# Patient Record
Sex: Male | Born: 1975 | Race: White | Hispanic: No | Marital: Married | Smoking: Never smoker
Health system: Southern US, Community
[De-identification: ages and names within clinical notes are randomized; demographics above are authoritative.]

## PROBLEM LIST (undated history)

## (undated) DIAGNOSIS — T7840XA Allergy, unspecified, initial encounter: Secondary | ICD-10-CM

## (undated) HISTORY — DX: Allergy, unspecified, initial encounter: T78.40XA

## (undated) HISTORY — PX: CHOLECYSTECTOMY: SHX55

## (undated) HISTORY — PX: KNEE SURGERY: SHX244

## (undated) HISTORY — PX: HAND SURGERY: SHX662

---

## 2006-01-08 ENCOUNTER — Ambulatory Visit: Payer: Self-pay | Admitting: General Practice

## 2006-02-06 ENCOUNTER — Ambulatory Visit: Payer: Self-pay | Admitting: Unknown Physician Specialty

## 2007-07-01 ENCOUNTER — Ambulatory Visit: Payer: Self-pay | Admitting: Gastroenterology

## 2007-07-04 ENCOUNTER — Ambulatory Visit: Payer: Self-pay | Admitting: Gastroenterology

## 2007-07-05 ENCOUNTER — Emergency Department: Payer: Self-pay | Admitting: Internal Medicine

## 2007-07-22 ENCOUNTER — Ambulatory Visit: Payer: Self-pay | Admitting: General Surgery

## 2007-08-29 HISTORY — PX: CHOLECYSTECTOMY: SHX55

## 2008-12-10 ENCOUNTER — Emergency Department: Payer: Self-pay | Admitting: Emergency Medicine

## 2008-12-15 ENCOUNTER — Ambulatory Visit: Payer: Self-pay | Admitting: General Practice

## 2011-06-03 ENCOUNTER — Ambulatory Visit: Payer: Self-pay | Admitting: General Practice

## 2012-11-11 ENCOUNTER — Ambulatory Visit: Payer: Self-pay | Admitting: General Practice

## 2014-03-12 ENCOUNTER — Ambulatory Visit: Payer: Self-pay | Admitting: General Practice

## 2014-11-25 ENCOUNTER — Ambulatory Visit
Admit: 2014-11-25 | Disposition: A | Discharge status: Home / Self Care | Payer: Self-pay | Attending: General Practice | Admitting: General Practice

## 2015-11-30 ENCOUNTER — Other Ambulatory Visit: Payer: Self-pay | Admitting: Family Medicine

## 2015-11-30 DIAGNOSIS — M545 Low back pain: Secondary | ICD-10-CM

## 2015-12-06 ENCOUNTER — Ambulatory Visit
Admission: RE | Admit: 2015-12-06 | Discharge: 2015-12-06 | Disposition: A | Discharge status: Home / Self Care | Payer: BLUE CROSS/BLUE SHIELD | Source: Ambulatory Visit | Attending: Family Medicine | Admitting: Family Medicine

## 2015-12-06 DIAGNOSIS — M2578 Osteophyte, vertebrae: Secondary | ICD-10-CM | POA: Insufficient documentation

## 2015-12-06 DIAGNOSIS — M5136 Other intervertebral disc degeneration, lumbar region: Secondary | ICD-10-CM | POA: Diagnosis not present

## 2015-12-06 DIAGNOSIS — M79606 Pain in leg, unspecified: Secondary | ICD-10-CM | POA: Diagnosis present

## 2015-12-06 DIAGNOSIS — M545 Low back pain: Secondary | ICD-10-CM

## 2015-12-06 DIAGNOSIS — M5126 Other intervertebral disc displacement, lumbar region: Secondary | ICD-10-CM | POA: Diagnosis not present

## 2017-12-11 ENCOUNTER — Ambulatory Visit
Admission: RE | Admit: 2017-12-11 | Discharge: 2017-12-11 | Disposition: A | Discharge status: Home / Self Care | Payer: BLUE CROSS/BLUE SHIELD | Source: Ambulatory Visit | Attending: Family Medicine | Admitting: Family Medicine

## 2017-12-11 ENCOUNTER — Other Ambulatory Visit: Payer: Self-pay | Admitting: Family Medicine

## 2017-12-11 DIAGNOSIS — R52 Pain, unspecified: Secondary | ICD-10-CM

## 2017-12-11 DIAGNOSIS — W3189XA Contact with other specified machinery, initial encounter: Secondary | ICD-10-CM | POA: Diagnosis not present

## 2017-12-11 DIAGNOSIS — S60941A Unspecified superficial injury of left index finger, initial encounter: Secondary | ICD-10-CM | POA: Insufficient documentation

## 2018-03-17 ENCOUNTER — Other Ambulatory Visit: Payer: Self-pay

## 2018-03-17 ENCOUNTER — Emergency Department
Admission: EM | Admit: 2018-03-17 | Discharge: 2018-03-17 | Disposition: A | Discharge status: Home / Self Care | Payer: BLUE CROSS/BLUE SHIELD | Attending: Emergency Medicine | Admitting: Emergency Medicine

## 2018-03-17 ENCOUNTER — Encounter: Payer: Self-pay | Admitting: Emergency Medicine

## 2018-03-17 DIAGNOSIS — S61211A Laceration without foreign body of left index finger without damage to nail, initial encounter: Secondary | ICD-10-CM | POA: Insufficient documentation

## 2018-03-17 DIAGNOSIS — Y929 Unspecified place or not applicable: Secondary | ICD-10-CM | POA: Diagnosis not present

## 2018-03-17 DIAGNOSIS — Z9049 Acquired absence of other specified parts of digestive tract: Secondary | ICD-10-CM | POA: Diagnosis not present

## 2018-03-17 DIAGNOSIS — W260XXA Contact with knife, initial encounter: Secondary | ICD-10-CM | POA: Diagnosis not present

## 2018-03-17 DIAGNOSIS — Y93G1 Activity, food preparation and clean up: Secondary | ICD-10-CM | POA: Insufficient documentation

## 2018-03-17 DIAGNOSIS — Y998 Other external cause status: Secondary | ICD-10-CM | POA: Diagnosis not present

## 2018-03-17 MED ORDER — BACITRACIN ZINC 500 UNIT/GM EX OINT
1.0000 "application " | TOPICAL_OINTMENT | Freq: Once | CUTANEOUS | Status: AC
  Administered 2018-03-17: 1 via TOPICAL
  Filled 2018-03-17: qty 0.9

## 2018-03-17 MED ORDER — LIDOCAINE HCL (PF) 1 % IJ SOLN
5.0000 mL | Freq: Once | INTRAMUSCULAR | Status: AC
  Administered 2018-03-17: 5 mL
  Filled 2018-03-17: qty 5

## 2018-03-17 NOTE — ED Triage Notes (Signed)
Pt comes into the ED via POV c/o laceration to the left index finger after he was trying to cut meat to cook.  Patient has bleeding under control at this time and is in NAD.

## 2018-03-17 NOTE — Discharge Instructions (Addendum)
Wash the area with soap and water daily.  You may change the bandage tomorrow.  If it is not actively bleeding you could just apply a Band-Aid over the top of it while you are at work.  Once your home allow the air to get to the wound so it would heal.  If you see redness, swelling, increased pain or drainage like pus please return to the emergency department or [facility] physician [facility].  Apply Vaseline to the suture line once daily.

## 2018-03-17 NOTE — ED Provider Notes (Signed)
[facility] Emergency Department Provider Note  ____________________________________________   First MD Initiated Contact with Patient 03/17/18 1719     (approximate)  I have reviewed the triage vital signs and the nursing notes.   HISTORY  Chief Complaint Laceration    HPI Raymond Russell is a 42 y.o. male presents emergency department complaining of a laceration to the left index finger.  He was cutting a piece of meat and ended up cutting his finger.  He states his tetanus is up-to-date.  He denies any numbness or tingling.  He denies any other injuries.  History reviewed. No pertinent past medical history.  There are no active problems to display for this patient.   Past Surgical History:  Procedure Laterality Date  . CHOLECYSTECTOMY    . HAND SURGERY    . KNEE SURGERY      Prior to Admission medications   Not on File    Allergies Patient has no known allergies.  No family history on file.  Social History Social History   Tobacco Use  . Smoking status: Never Smoker  . Smokeless tobacco: Never Used  Substance Use Topics  . Alcohol use: Yes  . Drug use: Not Currently    Review of Systems  Constitutional: No fever/chills Eyes: No visual changes. ENT: No sore throat. Respiratory: Denies cough Genitourinary: Negative for dysuria. Musculoskeletal: Negative for back pain.  Positive for laceration to the left index finger Skin: Negative for rash.    ____________________________________________   PHYSICAL EXAM:  VITAL SIGNS: ED Triage Vitals [03/17/18 1714]  Enc Vitals Group     BP (!) 146/101     Pulse Rate 90     Resp 16     Temp 98.2 F (36.8 C)     Temp Source Oral     SpO2 99 %     Weight 225 lb (102.1 kg)     Height 6\' 2"  (1.88 m)     Head Circumference      Peak Flow      Pain Score 6     Pain Loc      Pain Edu?      Excl. in GC?     Constitutional: Alert and oriented. Well appearing and in no  acute distress. Eyes: Conjunctivae are normal.  Head: Atraumatic. Nose: No congestion/rhinnorhea. Mouth/Throat: Mucous membranes are moist.   Cardiovascular: Normal rate, regular rhythm. Respiratory: Normal respiratory effort.  No retractions GU: deferred Musculoskeletal: FROM all extremities, warm and well perfused.  No bony tenderness is noted at the left index finger.  There is a laceration along the lateral side of the index finger which is triangulated.  No foreign body is noted. Neurologic:  Normal speech and language.  Skin:  Skin is warm, dry .  Positive for 1.5 cm laceration to the left index finger; no rash noted. Psychiatric: Mood and affect are normal. Speech and behavior are normal.  ____________________________________________   LABS (all labs ordered are listed, but only abnormal results are displayed)  Labs Reviewed - No data to display ____________________________________________   ____________________________________________  RADIOLOGY    ____________________________________________   PROCEDURES  Procedure(s) performed:   Marland KitchenMarland KitchenLaceration Repair Date/Time: 03/17/2018 6:35 PM Performed by: Faythe Ghee, PA-C Authorized by: Faythe Ghee, PA-C   Consent:    Consent obtained:  Verbal   Consent given by:  Patient   Risks discussed:  Pain, poor wound healing and tendon damage   Alternatives discussed:  No treatment  Anesthesia (see MAR for exact dosages):    Anesthesia method:  Nerve block   Block needle gauge:  25 G   Block anesthetic:  Lidocaine 1% w/o epi   Block injection procedure:  Anatomic landmarks identified, introduced needle, incremental injection, negative aspiration for blood and anatomic landmarks palpated   Block outcome:  Anesthesia achieved Laceration details:    Location:  Finger   Finger location:  L index finger   Length (cm):  1.5   Depth (mm):  2 Repair type:    Repair type:  Simple Pre-procedure details:    Preparation:   Patient was prepped and draped in usual sterile fashion Exploration:    Hemostasis achieved with:  Direct pressure   Wound exploration: wound explored through full range of motion     Wound extent: no foreign bodies/material noted, no muscle damage noted and no tendon damage noted     Contaminated: no   Treatment:    Area cleansed with:  Betadine and saline   Amount of cleaning:  Standard   Irrigation solution:  Sterile saline   Irrigation method:  Tap and syringe Skin repair:    Repair method:  Sutures   Suture size:  5-0   Suture material:  Nylon   Suture technique:  Simple interrupted   Number of sutures:  8 Approximation:    Approximation:  Close Post-procedure details:    Dressing:  Antibiotic ointment and non-adherent dressing   Patient tolerance of procedure:  Tolerated well, no immediate complications      ____________________________________________   INITIAL IMPRESSION / ASSESSMENT AND PLAN / ED COURSE  Pertinent labs & imaging results that were available during my care of the patient were reviewed by me and considered in my medical decision making (see chart for details).  Patient is a 42 year old male presents emergency department complaining of laceration to the left index finger.  He states his tetanus is up-to-date.  On physical exam there is a triangulated 1.5 cm laceration to the lateral aspect of the left index finger.  He has full range of motion and no bony tenderness.  No foreign body is noted.  The laceration was repaired by doing a digital block with 1% Xylocaine without epi, the area was cleaned with normal saline and Betadine, 5-0 Ethilon was used to place 8 simple sutures.  Patient tolerated procedure well.  Patient was given instructions on how to care for the laceration.  He states he understands and will comply.  He is to [place] with [facility] [place] for suture removal in 7 to 8 days.  He states he understands will comply.  He was discharged  in stable condition     As part of my medical decision making, I reviewed the following data within the electronic MEDICAL RECORD NUMBER Nursing notes reviewed and incorporated, Old chart reviewed, Notes from prior ED visits and [place] Controlled Substance Database  ____________________________________________   FINAL CLINICAL IMPRESSION(S) / ED DIAGNOSES  Final diagnoses:  Laceration of left index finger without damage to nail, foreign body presence unspecified, initial encounter      NEW MEDICATIONS STARTED DURING THIS VISIT:  There are no discharge medications for this patient.    Note:  This document was prepared using Dragon voice recognition software and may include unintentional dictation errors.    Faythe GheeFisher, Ariahna Smiddy W, PA-C 03/17/18 Daneil [id number]    McShane, James A, MD 03/17/18 2027

## 2018-03-17 NOTE — ED Notes (Signed)
Bacitracin applied to wound, wrapped with Telfa and gauze. Secured with tape.

## 2019-10-31 NOTE — Progress Notes (Signed)
Patient comes in today for pre physical labs and EKG. Patient is scheduled with Ron Smith, PA-C.  

## 2019-11-03 ENCOUNTER — Ambulatory Visit: Payer: Self-pay

## 2019-11-03 ENCOUNTER — Other Ambulatory Visit: Payer: Self-pay

## 2019-11-03 DIAGNOSIS — Z0000 Encounter for general adult medical examination without abnormal findings: Secondary | ICD-10-CM

## 2019-11-03 LAB — POCT URINALYSIS DIPSTICK
Bilirubin, UA: NEGATIVE
Blood, UA: NEGATIVE
Glucose, UA: NEGATIVE
Ketones, UA: NEGATIVE
Leukocytes, UA: NEGATIVE
Nitrite, UA: NEGATIVE
Protein, UA: NEGATIVE
Spec Grav, UA: 1.025 (ref 1.010–1.025)
Urobilinogen, UA: 0.2 E.U./dL
pH, UA: 6 (ref 5.0–8.0)

## 2019-11-04 LAB — CMP12+LP+TP+TSH+6AC+PSA+CBC…
AST: 26 IU/L (ref 0–40)
Albumin/Globulin Ratio: 1.8 (ref 1.2–2.2)
Albumin: 4.6 g/dL (ref 4.0–5.0)
Alkaline Phosphatase: 45 IU/L (ref 39–117)
BUN/Creatinine Ratio: 17 (ref 9–20)
Calcium: 9 mg/dL (ref 8.7–10.2)
Chloride: 101 mmol/L (ref 96–106)
Chol/HDL Ratio: 5.2 ratio — ABNORMAL HIGH (ref 0.0–5.0)
Cholesterol, Total: 250 mg/dL — ABNORMAL HIGH (ref 100–199)
EOS (ABSOLUTE): 0.3 10*3/uL (ref 0.0–0.4)
Estimated CHD Risk: 1.1 times avg. — ABNORMAL HIGH (ref 0.0–1.0)
Free Thyroxine Index: 1.4 (ref 1.2–4.9)
GFR calc Af Amer: 91 mL/min/{1.73_m2} (ref >59)
Globulin, Total: 2.5 g/dL (ref 1.5–4.5)
Glucose: 98 mg/dL (ref 65–99)
HDL: 48 mg/dL (ref >39)
Hematocrit: 49.9 % (ref 37.5–51.0)
Hemoglobin: 17.1 g/dL (ref 13.0–17.7)
Immature Grans (Abs): 0.1 10*3/uL (ref 0.0–0.1)
LDL Chol Calc (NIH): 167 mg/dL — ABNORMAL HIGH (ref 0–99)
Lymphocytes Absolute: 2.1 10*3/uL (ref 0.7–3.1)
MCH: 30.4 pg (ref 26.6–33.0)
MCV: 89 fL (ref 79–97)
Neutrophils Absolute: 5.4 10*3/uL (ref 1.4–7.0)
Platelets: 276 10*3/uL (ref 150–450)
RBC: 5.63 x10E6/uL (ref 4.14–5.80)
Total Protein: 7.1 g/dL (ref 6.0–8.5)
Triglycerides: 191 mg/dL — ABNORMAL HIGH (ref 0–149)
Uric Acid: 8.7 mg/dL — ABNORMAL HIGH (ref 3.8–8.4)
VLDL Cholesterol Cal: 35 mg/dL (ref 5–40)
WBC: 8.7 10*3/uL (ref 3.4–10.8)

## 2019-11-04 LAB — CMP12+LP+TP+TSH+6AC+PSA+CBC?
ALT: 27 IU/L (ref 0–44)
BUN: 19 mg/dL (ref 6–24)
Basophils Absolute: 0.1 10*3/uL (ref 0.0–0.2)
Basos: 1 %
Bilirubin Total: 0.4 mg/dL (ref 0.0–1.2)
Creatinine, Ser: 1.12 mg/dL (ref 0.76–1.27)
Eos: 4 %
GFR calc non Af Amer: 79 mL/min/{1.73_m2} (ref >59)
GGT: 35 IU/L (ref 0–65)
Immature Granulocytes: 1 %
Iron: 142 ug/dL (ref 38–169)
LDH: 188 IU/L (ref 121–224)
Lymphs: 24 %
MCHC: 34.3 g/dL (ref 31.5–35.7)
Monocytes Absolute: 0.8 10*3/uL (ref 0.1–0.9)
Monocytes: 9 %
Neutrophils: 61 %
Phosphorus: 3.2 mg/dL (ref 2.8–4.1)
Potassium: 4.7 mmol/L (ref 3.5–5.2)
Prostate Specific Ag, Serum: 0.8 ng/mL (ref 0.0–4.0)
RDW: 13.4 % (ref 11.6–15.4)
Sodium: 138 mmol/L (ref 134–144)
T3 Uptake Ratio: 26 % (ref 24–39)
T4, Total: 5.5 ug/dL (ref 4.5–12.0)
TSH: 3.45 u[IU]/mL (ref 0.450–4.500)

## 2019-11-10 ENCOUNTER — Other Ambulatory Visit: Payer: Self-pay

## 2019-11-10 ENCOUNTER — Ambulatory Visit: Payer: Self-pay | Admitting: Physician Assistant

## 2019-11-10 VITALS — BP 143/94 | HR 88 | Temp 97.5°F | Ht 74.0 in | Wt 236.6 lb

## 2019-11-10 DIAGNOSIS — Z0000 Encounter for general adult medical examination without abnormal findings: Secondary | ICD-10-CM

## 2019-11-10 DIAGNOSIS — E785 Hyperlipidemia, unspecified: Secondary | ICD-10-CM

## 2019-11-10 DIAGNOSIS — E782 Mixed hyperlipidemia: Secondary | ICD-10-CM

## 2019-11-10 HISTORY — DX: Hyperlipidemia, unspecified: E78.5

## 2019-11-10 NOTE — Progress Notes (Signed)
   Subjective: Annual physical    Patient ID: Raymond Russell, male    DOB: 19-Aug-1976, 44 y.o.   MRN: [id number]  HPI Patient presents with annual physical.  Patient voiced no concerns or complaints.   Review of Systems Negative    Objective:   Physical Exam No acute distress. HEENT is unremarkable. Neck is supple for adenopathy or bruits. Lungs are clear to auscultation. Heart regular rate and rhythm. Abdomen with negative HSM, normoactive bowel sounds, soft and nontender to palpation. No obvious deformity to the upper or lower extremities.  Upper and lower extremities have full/ equal range of motion. No obvious deformity to the cervical lumbar spine.  Patient has full equal range of motion of the cervical lumbar spine. Cranial nerves II through XII are grossly intact. Reviewed patient's lab results showing elevated uric acid level and hyperlipidemia.       Assessment & Plan: Well exam  Hyperlipidemia.  Discussed lab results with patient elected to try 6 months of diet and exercise to control lipids.  Patient will follow up in 6 months.

## 2020-03-08 IMAGING — CR DG FINGER INDEX 2+V*L*
1 series · 3 of 3 positions shown · non-contrast
Comparison: None.

CLINICAL DATA: Drill bit penetrated finger

EXAM:
LEFT SECOND FINGER 2+V

[Series 1: dg finger index left · 0.14mm/px · 3 of 3 slices shown]
[im 1/3]
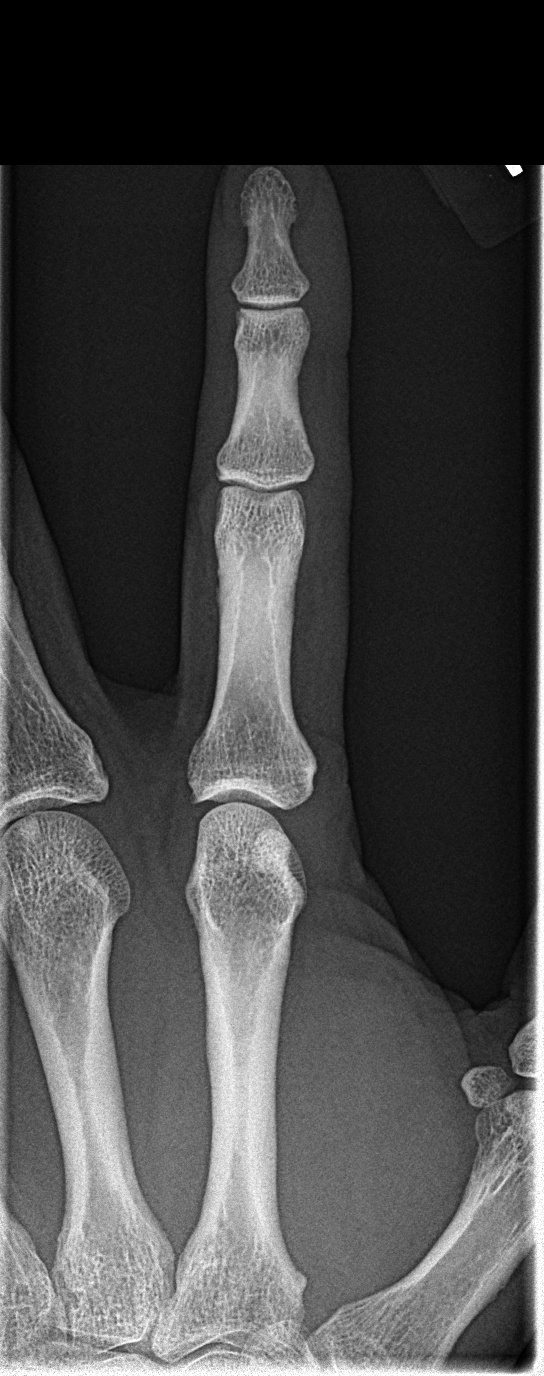
[im 2/3]
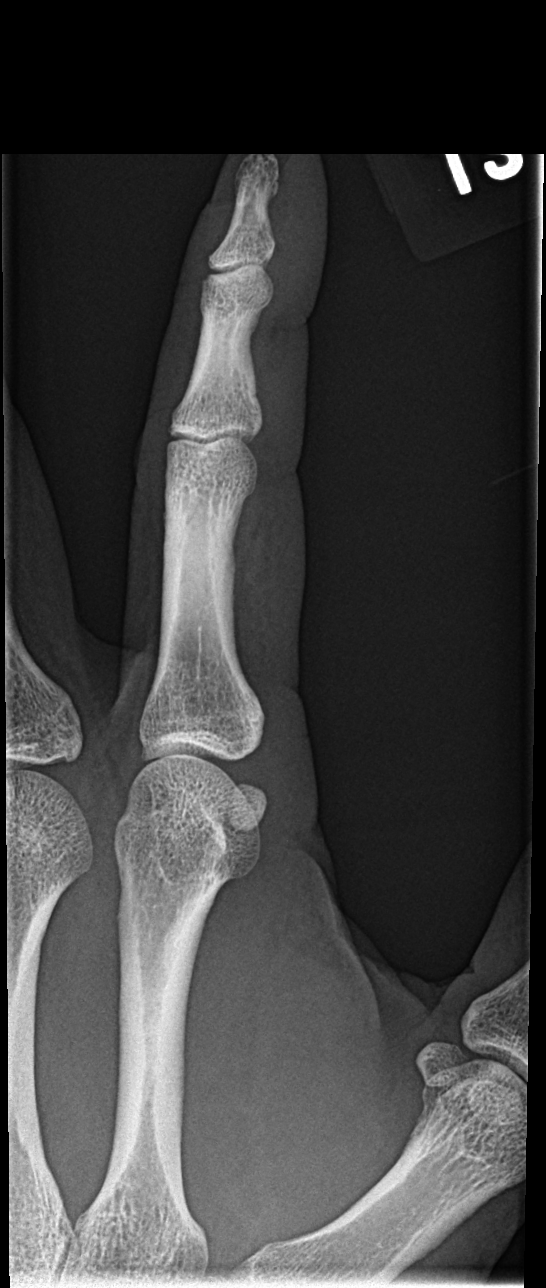
[im 3/3]
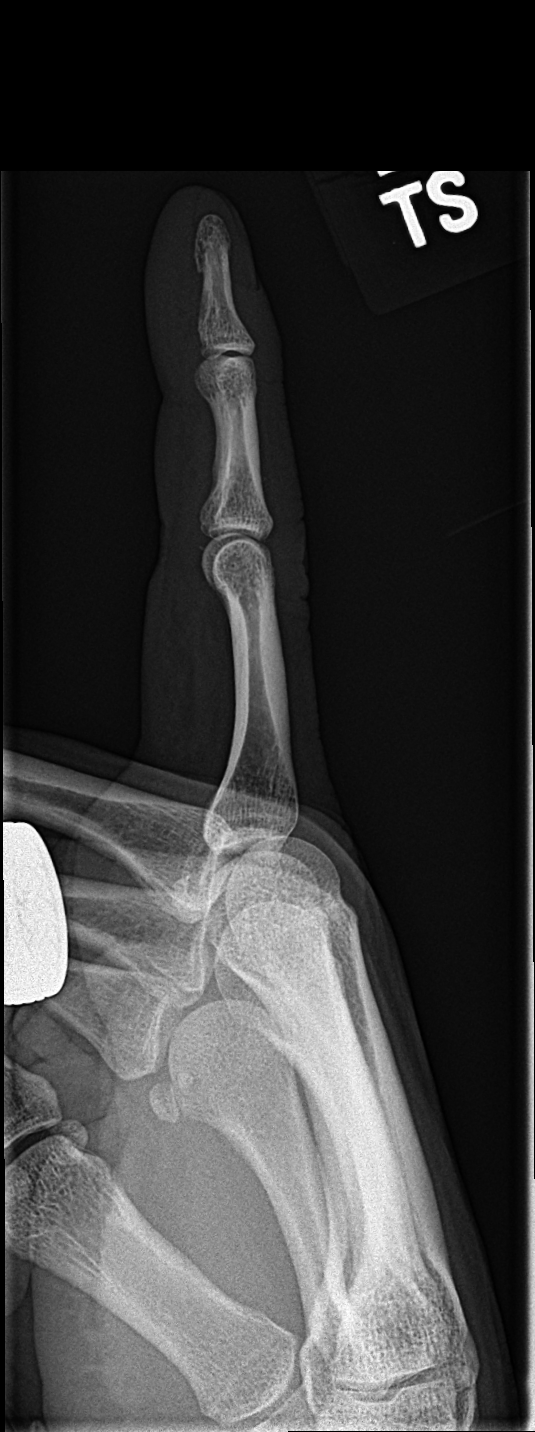

[3 of 3 positions shown; findings below may reference images not displayed]

FINDINGS: Frontal, oblique, and lateral views obtained. No fracture or
dislocation. Joint spaces appear intact. No radiopaque foreign body
or soft tissue air.
IMPRESSION: No soft tissue air or radiopaque foreign body. No fracture or
dislocation. No evident arthropathy.

## 2020-03-10 ENCOUNTER — Ambulatory Visit: Payer: 59

## 2020-03-16 ENCOUNTER — Encounter: Payer: Self-pay | Admitting: Physician Assistant

## 2020-03-16 ENCOUNTER — Other Ambulatory Visit: Payer: Self-pay

## 2020-03-16 ENCOUNTER — Ambulatory Visit: Payer: Self-pay | Admitting: Physician Assistant

## 2020-03-16 VITALS — BP 120/84 | HR 86 | Temp 98.8°F | Resp 16 | Ht 74.0 in | Wt 228.0 lb

## 2020-03-16 DIAGNOSIS — M25532 Pain in left wrist: Secondary | ICD-10-CM

## 2020-03-16 NOTE — Progress Notes (Signed)
   Subjective: Left elbow pain    Patient ID: Raymond Russell, male    DOB: [birth year], 44 y.o.   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  Patient states 2 years ago he received moderate relief of epicondyle steroid injection by orthopedics.  Patient stated about 6 weeks to 2 months injection effects wore off and he was placed in physical therapy and again mild relief.  Patient states physical therapy is no longer helping with the pain.  Patient also complained of a new onset of morning stiffness in the bilateral upper extremities.  Patient also complained of increased pain and swelling to the right middle finger.  No provocative incident for complaint.  Patient is requesting consult to Ortho for evaluation and possible steroidal injection.   Review of Systems     Objective:   Physical Exam No acute distress.  No obvious deformity to the upper extremities.  Patient is moderate guarding palpation medial epicondyle of the left elbow.  No edema or erythema.  Patient also has marked edema to the MPJ of the third digit right hand.       Assessment & Plan: Left elbow epicondylitis  Patient complaint physical exam is also consistent with arthralgia.  Further evaluation with CBC and sed rate.  Consult orthopedic for further evaluation and treatment of epicondylitis.  Follow-up after lab test.

## 2020-03-16 NOTE — Progress Notes (Signed)
Pt has reoccurring pain in elbow and states that therapy is not helping that it only helps for that day and then starts over. Pt also states that a couple months ago his hands and fingers have started to get stiff with swelling and pain in joints of fingers. CL, RMA

## 2020-03-17 LAB — SEDIMENTATION RATE: Sed Rate: 8 mm/hr (ref 0–15)

## 2020-03-17 LAB — CBC WITH DIFFERENTIAL/PLATELET
Basophils Absolute: 0.1 x10E3/uL (ref 0.0–0.2)
Basos: 1 %
EOS (ABSOLUTE): 0.2 x10E3/uL (ref 0.0–0.4)
Eos: 2 %
Hematocrit: 50.2 % (ref 37.5–51.0)
Hemoglobin: 16.3 g/dL (ref 13.0–17.7)
Immature Grans (Abs): 0 x10E3/uL (ref 0.0–0.1)
Immature Granulocytes: 1 %
Lymphocytes Absolute: 2 x10E3/uL (ref 0.7–3.1)
Lymphs: 23 %
MCH: 29.5 pg (ref 26.6–33.0)
MCHC: 32.5 g/dL (ref 31.5–35.7)
MCV: 91 fL (ref 79–97)
Monocytes Absolute: 0.6 x10E3/uL (ref 0.1–0.9)
Monocytes: 7 %
Neutrophils Absolute: 5.7 x10E3/uL (ref 1.4–7.0)
Neutrophils: 66 %
Platelets: 287 x10E3/uL (ref 150–450)
RBC: 5.52 x10E6/uL (ref 4.14–5.80)
RDW: 13.7 % (ref 11.6–15.4)
WBC: 8.6 x10E3/uL (ref 3.4–10.8)

## 2020-03-17 LAB — RHEUMATOID FACTOR: Rheumatoid fact SerPl-aCnc: <10 [IU]/mL (ref 0.0–13.9)

## 2020-03-22 NOTE — Addendum Note (Signed)
Addended by: Gardner Candle on: 03/22/2020 11:24 AM   Modules accepted: Orders

## 2020-04-05 DIAGNOSIS — M654 Radial styloid tenosynovitis [de Quervain]: Secondary | ICD-10-CM | POA: Diagnosis not present

## 2020-04-05 DIAGNOSIS — M7712 Lateral epicondylitis, left elbow: Secondary | ICD-10-CM | POA: Diagnosis not present

## 2020-05-12 ENCOUNTER — Other Ambulatory Visit: Payer: Self-pay

## 2020-09-03 ENCOUNTER — Other Ambulatory Visit: Payer: Self-pay

## 2020-09-03 DIAGNOSIS — Z1152 Encounter for screening for COVID-19: Secondary | ICD-10-CM

## 2020-09-07 LAB — NOVEL CORONAVIRUS, NAA: SARS-CoV-2, NAA: DETECTED — AB

## 2020-11-15 ENCOUNTER — Other Ambulatory Visit: Payer: Self-pay

## 2020-11-15 ENCOUNTER — Encounter: Payer: Self-pay | Admitting: Physician Assistant

## 2020-11-15 ENCOUNTER — Ambulatory Visit: Payer: Self-pay | Admitting: Physician Assistant

## 2020-11-15 VITALS — BP 131/93 | HR 86 | Temp 97.7°F | Resp 12 | Ht 74.0 in | Wt 241.0 lb

## 2020-11-15 DIAGNOSIS — M7662 Achilles tendinitis, left leg: Secondary | ICD-10-CM

## 2020-11-15 MED ORDER — LIDOCAINE 5 % EX PTCH: 1.0000 | MEDICATED_PATCH | CUTANEOUS | 0 refills | Status: DC

## 2020-11-15 MED ORDER — NAPROXEN 500 MG PO TABS: 500.0000 mg | ORAL_TABLET | Freq: Two times a day (BID) | ORAL | Status: DC

## 2020-11-15 NOTE — Progress Notes (Signed)
   Subjective: Left posterior heel pain    Patient ID: Raymond Russell, male    DOB: July 04, 1976, 45 y.o.   MRN: [id number]  HPI Patient complains of nonprovocative left posterior heel pain for 5 days.  Patient states no relief over-the-counter Aleve.  Describes the pain as "achy".  Rates the pain as a 5/10.  States pain increased with flexion/ extension and ambulation.   Review of Systems    Negative except for complaint Objective:   Physical Exam No acute distress.  Normal gait.  BP is 131/93, pulse 86, respiration 12, temperature 97.7, and patient is 90% O2 sat on room air. Mild bilateral pes planus.  Mild edema at insertion point of the Achilles tendon.  Moderate guarding with palpation.  Complain of pain with extension of the foot.       Assessment & Plan: Achilles tendinitis  Discussed conservative treatment consisting of NSAIDs Lidoderm patches.  Advised to follow-up in 1 week if no improvement or worsening complaint.  We will consider x-rays and podiatry consult.

## 2020-11-15 NOTE — Progress Notes (Signed)
Left heel pain since last Wednesday. No known injury. Feels like a knot & feels tight. Has been taking Aleve No ice/heat or elastic support.  AMD

## 2020-12-23 NOTE — Progress Notes (Signed)
Scheduled to complete physical 12/29/20 with Ron Smith, PA-C.  AMD 

## 2020-12-24 ENCOUNTER — Other Ambulatory Visit: Payer: Self-pay

## 2020-12-24 ENCOUNTER — Ambulatory Visit: Payer: Self-pay

## 2020-12-24 DIAGNOSIS — Z0000 Encounter for general adult medical examination without abnormal findings: Secondary | ICD-10-CM

## 2020-12-24 LAB — POCT URINALYSIS DIPSTICK
Bilirubin, UA: NEGATIVE
Blood, UA: NEGATIVE
Glucose, UA: NEGATIVE
Ketones, UA: NEGATIVE
Leukocytes, UA: NEGATIVE
Nitrite, UA: NEGATIVE
Protein, UA: NEGATIVE
Spec Grav, UA: 1.02 (ref 1.010–1.025)
Urobilinogen, UA: 0.2 E.U./dL
pH, UA: 6 (ref 5.0–8.0)

## 2020-12-25 LAB — CMP12+LP+TP+TSH+6AC+PSA+CBC…
ALT: 27 IU/L (ref 0–44)
AST: 21 IU/L (ref 0–40)
Albumin/Globulin Ratio: 1.8 (ref 1.2–2.2)
Albumin: 4.6 g/dL (ref 4.0–5.0)
Alkaline Phosphatase: 46 IU/L (ref 44–121)
BUN/Creatinine Ratio: 10 (ref 9–20)
BUN: 10 mg/dL (ref 6–24)
Basophils Absolute: 0.1 10*3/uL (ref 0.0–0.2)
Basos: 1 %
Bilirubin Total: 0.3 mg/dL (ref 0.0–1.2)
Calcium: 9 mg/dL (ref 8.7–10.2)
Chloride: 101 mmol/L (ref 96–106)
Chol/HDL Ratio: 4.6 ratio (ref 0.0–5.0)
Cholesterol, Total: 213 mg/dL — ABNORMAL HIGH (ref 100–199)
Creatinine, Ser: 1.03 mg/dL (ref 0.76–1.27)
EOS (ABSOLUTE): 0.3 10*3/uL (ref 0.0–0.4)
Eos: 4 %
Estimated CHD Risk: 0.9 times avg. (ref 0.0–1.0)
Free Thyroxine Index: 1.8 (ref 1.2–4.9)
GGT: 29 IU/L (ref 0–65)
Globulin, Total: 2.5 g/dL (ref 1.5–4.5)
Glucose: 87 mg/dL (ref 65–99)
HDL: 46 mg/dL (ref >39)
Hematocrit: 49.6 % (ref 37.5–51.0)
Hemoglobin: 16.2 g/dL (ref 13.0–17.7)
Immature Grans (Abs): 0 10*3/uL (ref 0.0–0.1)
Immature Granulocytes: 0 %
Iron: 67 ug/dL (ref 38–169)
LDH: 225 IU/L — ABNORMAL HIGH (ref 121–224)
LDL Chol Calc (NIH): 157 mg/dL — ABNORMAL HIGH (ref 0–99)
Lymphocytes Absolute: 1.9 10*3/uL (ref 0.7–3.1)
Lymphs: 25 %
MCH: 29.3 pg (ref 26.6–33.0)
MCHC: 32.7 g/dL (ref 31.5–35.7)
MCV: 90 fL (ref 79–97)
Monocytes Absolute: 0.6 10*3/uL (ref 0.1–0.9)
Monocytes: 8 %
Neutrophils Absolute: 4.7 10*3/uL (ref 1.4–7.0)
Neutrophils: 62 %
Phosphorus: 2.4 mg/dL — ABNORMAL LOW (ref 2.8–4.1)
Platelets: 312 10*3/uL (ref 150–450)
Potassium: 4.4 mmol/L (ref 3.5–5.2)
Prostate Specific Ag, Serum: 0.9 ng/mL (ref 0.0–4.0)
RBC: 5.52 x10E6/uL (ref 4.14–5.80)
RDW: 13.5 % (ref 11.6–15.4)
Sodium: 140 mmol/L (ref 134–144)
T3 Uptake Ratio: 24 % (ref 24–39)
T4, Total: 7.4 ug/dL (ref 4.5–12.0)
TSH: 1.75 u[IU]/mL (ref 0.450–4.500)
Total Protein: 7.1 g/dL (ref 6.0–8.5)
Triglycerides: 57 mg/dL (ref 0–149)
Uric Acid: 7.6 mg/dL (ref 3.8–8.4)
VLDL Cholesterol Cal: 10 mg/dL (ref 5–40)
WBC: 7.5 10*3/uL (ref 3.4–10.8)
eGFR: 91 mL/min/{1.73_m2} (ref >59)

## 2020-12-29 ENCOUNTER — Other Ambulatory Visit: Payer: Self-pay

## 2020-12-29 ENCOUNTER — Ambulatory Visit: Payer: Self-pay | Admitting: Physician Assistant

## 2020-12-29 ENCOUNTER — Encounter: Payer: Self-pay | Admitting: Physician Assistant

## 2020-12-29 VITALS — BP 121/87 | HR 90 | Temp 97.8°F | Resp 14 | Ht 74.0 in | Wt 240.0 lb

## 2020-12-29 DIAGNOSIS — Z0000 Encounter for general adult medical examination without abnormal findings: Secondary | ICD-10-CM

## 2020-12-29 NOTE — Progress Notes (Signed)
   Subjective: Annual physical exam    Patient ID: Raymond Russell, male    DOB: 10-01-1975, 45 y.o.   MRN: [id number]  HPI Patient presents for annual physical exam.  Most concerning for increased fatigue and decreased libido.   Review of Systems Negative septal complaint    Objective:   Physical Exam  No acute distress.  Temperature 97.8, pulse 90, respiration 14, BP is 121/87, and patient 96% O2 sat on room air.  Patient BMI is 30.8. HEENT is unremarkable.  Neck is supple for adenopathy or bruits.  Lungs are clear to auscultation.  Heart regular rate and rhythm.  No acute labs or EKG. Abdomen with negative HSM, normoactive bowel sounds, soft, nontender to palpation. No obvious deformity to the upper or lower extremities.  Patient has full and equal range of motion of the upper and lower extremities. No obvious deformity to cervical and lumbar spine.  Patient has full and equal range of motion of the cervical and lumbar spine. Cranial nerves II through XII grossly intact.       Assessment & Plan: Well exam.  Discussed lab results with patient showing a decrease of his cholesterol from 250 with a new reading of 213.  Patient triglycerides decreased from 191 200 reading of 57.  Patient HDL decreased from 48 to admit reading of 46.  Patient LDL decreased from 167 to a low reading of 157.  Patient elected to continue lifestyle modification and exercise.  Patient will have lab work to further evaluate fatigue.

## 2020-12-30 NOTE — Progress Notes (Signed)
Labs requested by Durward Parcel, PA-C at 12/29/20 physical.  AMD

## 2020-12-31 ENCOUNTER — Other Ambulatory Visit: Payer: Self-pay

## 2020-12-31 DIAGNOSIS — R5383 Other fatigue: Secondary | ICD-10-CM

## 2021-01-02 LAB — TESTOSTERONE,FREE AND TOTAL
Testosterone, Free: 10.3 pg/mL (ref 6.8–21.5)
Testosterone: 383 ng/dL (ref 264–916)

## 2021-01-05 ENCOUNTER — Other Ambulatory Visit: Payer: Self-pay

## 2021-01-05 DIAGNOSIS — Z0283 Encounter for blood-alcohol and blood-drug test: Secondary | ICD-10-CM

## 2021-01-05 NOTE — Progress Notes (Signed)
[facility] Acct #:  [id number] [facility] Specimen #:  [id number]  AMD

## 2021-01-18 DIAGNOSIS — H524 Presbyopia: Secondary | ICD-10-CM | POA: Diagnosis not present

## 2021-09-30 ENCOUNTER — Other Ambulatory Visit: Payer: Self-pay

## 2021-09-30 NOTE — Progress Notes (Signed)
Presents to [facility] for Random DOT Drug Screen & Alcohol Screen.  [facility] Acct #:  [id number] [facility] Specimen #:  [id number]  AMD

## 2021-12-29 ENCOUNTER — Ambulatory Visit: Payer: Self-pay

## 2021-12-29 DIAGNOSIS — Z0000 Encounter for general adult medical examination without abnormal findings: Secondary | ICD-10-CM

## 2021-12-29 LAB — POCT URINALYSIS DIPSTICK
Bilirubin, UA: NEGATIVE
Blood, UA: NEGATIVE
Glucose, UA: NEGATIVE
Ketones, UA: NEGATIVE
Leukocytes, UA: NEGATIVE
Nitrite, UA: NEGATIVE
Protein, UA: NEGATIVE
Spec Grav, UA: <=1.005 — AB (ref 1.010–1.025)
Urobilinogen, UA: 0.2 E.U./dL
pH, UA: 7 (ref 5.0–8.0)

## 2021-12-29 NOTE — Progress Notes (Signed)
Pt presents today for physical labs, will return to clinic for scheduled physical.  

## 2021-12-30 LAB — CMP12+LP+TP+TSH+6AC+PSA+CBC?
AST: 72 IU/L — ABNORMAL HIGH (ref 0–40)
Albumin: 5.1 g/dL — ABNORMAL HIGH (ref 4.0–5.0)
Alkaline Phosphatase: 48 IU/L (ref 44–121)
BUN/Creatinine Ratio: 11 (ref 9–20)
BUN: 11 mg/dL (ref 6–24)
Bilirubin Total: 0.4 mg/dL (ref 0.0–1.2)
Calcium: 9.8 mg/dL (ref 8.7–10.2)
Chol/HDL Ratio: 5.6 ratio — ABNORMAL HIGH (ref 0.0–5.0)
Creatinine, Ser: 1.01 mg/dL (ref 0.76–1.27)
Eos: 4 %
Free Thyroxine Index: 2 (ref 1.2–4.9)
GGT: 40 IU/L (ref 0–65)
HDL: 47 mg/dL (ref >39)
Hematocrit: 50.5 % (ref 37.5–51.0)
Immature Grans (Abs): 0 10*3/uL (ref 0.0–0.1)
LDH: 257 IU/L — ABNORMAL HIGH (ref 121–224)
LDL Chol Calc (NIH): 188 mg/dL — ABNORMAL HIGH (ref 0–99)
MCH: 30.1 pg (ref 26.6–33.0)
MCV: 88 fL (ref 79–97)
Potassium: 4.3 mmol/L (ref 3.5–5.2)
RDW: 13 % (ref 11.6–15.4)
T4, Total: 7.8 ug/dL (ref 4.5–12.0)
TSH: 2.27 u[IU]/mL (ref 0.450–4.500)
Total Protein: 7.7 g/dL (ref 6.0–8.5)
VLDL Cholesterol Cal: 30 mg/dL (ref 5–40)
WBC: 7.9 10*3/uL (ref 3.4–10.8)
eGFR: 92 mL/min/{1.73_m2} (ref >59)

## 2021-12-30 LAB — CMP12+LP+TP+TSH+6AC+PSA+CBC…
ALT: 115 IU/L — ABNORMAL HIGH (ref 0–44)
Albumin/Globulin Ratio: 2 (ref 1.2–2.2)
Basophils Absolute: 0 10*3/uL (ref 0.0–0.2)
Basos: 1 %
Chloride: 99 mmol/L (ref 96–106)
Cholesterol, Total: 265 mg/dL — ABNORMAL HIGH (ref 100–199)
EOS (ABSOLUTE): 0.3 10*3/uL (ref 0.0–0.4)
Estimated CHD Risk: 1.2 times avg. — ABNORMAL HIGH (ref 0.0–1.0)
Globulin, Total: 2.6 g/dL (ref 1.5–4.5)
Glucose: 98 mg/dL (ref 70–99)
Hemoglobin: 17.2 g/dL (ref 13.0–17.7)
Immature Granulocytes: 0 %
Iron: 107 ug/dL (ref 38–169)
Lymphocytes Absolute: 1.8 10*3/uL (ref 0.7–3.1)
Lymphs: 23 %
MCHC: 34.1 g/dL (ref 31.5–35.7)
Monocytes Absolute: 0.6 10*3/uL (ref 0.1–0.9)
Monocytes: 8 %
Neutrophils Absolute: 5.1 10*3/uL (ref 1.4–7.0)
Neutrophils: 64 %
Phosphorus: 3 mg/dL (ref 2.8–4.1)
Platelets: 279 10*3/uL (ref 150–450)
Prostate Specific Ag, Serum: 1 ng/mL (ref 0.0–4.0)
RBC: 5.72 x10E6/uL (ref 4.14–5.80)
Sodium: 137 mmol/L (ref 134–144)
T3 Uptake Ratio: 26 % (ref 24–39)
Triglycerides: 159 mg/dL — ABNORMAL HIGH (ref 0–149)
Uric Acid: 7.1 mg/dL (ref 3.8–8.4)

## 2022-01-02 ENCOUNTER — Encounter: Payer: Self-pay | Admitting: Physician Assistant

## 2022-01-02 ENCOUNTER — Ambulatory Visit: Payer: Self-pay | Admitting: Physician Assistant

## 2022-01-02 VITALS — BP 116/84 | HR 78 | Temp 97.8°F | Resp 16 | Ht 74.0 in | Wt 242.0 lb

## 2022-01-02 DIAGNOSIS — Z0000 Encounter for general adult medical examination without abnormal findings: Secondary | ICD-10-CM

## 2022-01-02 DIAGNOSIS — R0683 Snoring: Secondary | ICD-10-CM

## 2022-01-02 MED ORDER — ROSUVASTATIN CALCIUM 40 MG PO TABS: 40.0000 mg | ORAL_TABLET | Freq: Every day | ORAL | 3 refills | Status: DC

## 2022-01-02 NOTE — Progress Notes (Addendum)
[facility]  ____________________________________________   None    (approximate)  I have reviewed the triage vital signs and the nursing notes.   HISTORY  Chief Complaint No chief complaint on file.    HPI Raymond Russell is a 46 y.o. male patient presents for annual physical exam.  Patient was concerned for excessive snoring.  Patient states excessive snoring greater than 1 year is increased in the past 4 months.  Patient also complain of daytime sleepiness but denies denies excessive fatigue or dyspnea.  Patient stated his significant other has complain of his snoring that keeps her awake.  Patient denies gasping for breath or choking.  Patient denies morning headaches.  Patient states this has developed since his last exam.  Patient has history of hyperlipidemia.  No medicine for this condition.         Past Medical History:  Diagnosis Date   Allergy    Hyperlipidemia 11/10/2019    Patient Active Problem List   Diagnosis Date Noted   Hyperlipidemia 11/10/2019    Past Surgical History:  Procedure Laterality Date   CHOLECYSTECTOMY     CHOLECYSTECTOMY  2009   HAND SURGERY     KNEE SURGERY      Prior to Admission medications   Medication Sig Start Date End Date Taking? Authorizing Provider  cetirizine (ZYRTEC) 10 MG chewable tablet Chew 10 mg by mouth daily.   Yes [provider]  rosuvastatin (CRESTOR) 40 MG tablet Take 1 tablet (40 mg total) by mouth daily. 01/02/22  Yes Sable Feil, PA-C    Allergies Patient has no known allergies.  Family History  Problem Relation Age of Onset   Colon cancer Paternal Grandfather     Social History Social History   Tobacco Use   Smoking status: Never   Smokeless tobacco: Current    Types: Chew  Substance Use Topics   Alcohol use: Yes   Drug use: Not Currently    Review of Systems Constitutional: No fever/chills Eyes: No visual changes. ENT: No sore  throat. Cardiovascular: Denies chest pain. Respiratory: Denies shortness of breath.  Excessive snoring Gastrointestinal: No abdominal pain.  No nausea, no vomiting.  No diarrhea.  No constipation. Genitourinary: Negative for dysuria. Musculoskeletal: Negative for back pain. Skin: Negative for rash. Neurological: Negative for headaches, focal weakness or numbness. Endocrine: Hyperlipidemia   ____________________________________________   PHYSICAL EXAM:  VITAL SIGNS: BP is 116/84, pulse 78, respirations 16, temperature is 97.8, patient 95% O2 sat on room air.  Patient weighs 242 pounds and BMI is 31.07. Constitutional: Alert and oriented. Well appearing and in no acute distress. Eyes: Conjunctivae are normal. PERRL. EOMI. Head: Atraumatic. Nose: No congestion/rhinnorhea. Mouth/Throat: Mucous membranes are moist.  Oropharynx non-erythematous. Neck: No stridor.  No cervical spine tenderness to palpation. Hematological/Lymphatic/Immunilogical: No cervical lymphadenopathy. Cardiovascular: Normal rate, regular rhythm. Grossly normal heart sounds.  Good peripheral circulation. Respiratory: Normal respiratory effort.  No retractions. Lungs CTAB. Gastrointestinal: Soft and nontender. No distention. No abdominal bruits. No CVA tenderness. Genitourinary: Deferred Musculoskeletal: No lower extremity tenderness nor edema.  No joint effusions. Neurologic:  Normal speech and language. No gross focal neurologic deficits are appreciated. No gait instability. Skin:  Skin is warm, dry and intact. No rash noted. Psychiatric: Mood and affect are normal. Speech and behavior are normal.  ____________________________________________   LABS ___       Component Ref Range & Units 4 d ago 1 yr ago 2 yr ago  Color,  UA  light yellow  Yellow  yellow   Clarity, UA  clear  Clear  clear   Glucose, UA Negative Negative  Negative  Negative   Bilirubin, UA  negative  Negative  negative   Ketones, UA  negative   Negative  negative   Spec Grav, UA 1.010 - 1.025 <=1.005 Abnormal   1.020  1.025   Blood, UA  negative  Negative  negative   pH, UA 5.0 - 8.0 7.0  6.0  6.0   Protein, UA Negative Negative  Negative  Negative   Urobilinogen, UA 0.2 or 1.0 E.U./dL 0.2  0.2  0.2   Nitrite, UA  negative  Negative  negative   Leukocytes, UA Negative Negative  Negative  Negative   Appearance        Odor               Specimen Collected: 12/29/21 09:36 Last Resulted: 12/29/21 09:36       View Encounter Conversation              Other Results from 12/29/2021   Contains abnormal data CMP12+LP+TP+TSH+6AC+PSA+CBC. Order: [id number] Status: Final result    Visible to patient: Yes (seen)    Next appt: None    Dx: Routine adult health maintenance    0 Result Notes        Component Ref Range & Units 4 d ago (12/29/21) 1 yr ago (12/24/20) 1 yr ago (03/16/20) 2 yr ago (11/03/19)  Glucose 70 - 99 mg/dL 98  87 R   98 R   Uric Acid 3.8 - 8.4 mg/dL 7.1  7.6 CM   8.7 High  CM   Comment:            Therapeutic target for gout patients: <6.0  BUN 6 - 24 mg/dL $Remove'11  10   19   'VgTCUoy$ Creatinine, Ser 0.76 - 1.27 mg/dL 1.01  1.03   1.12   eGFR >59 mL/min/1.73 92  91     BUN/Creatinine Ratio 9 - $R'20 11  10   17   'iG$ Sodium 134 - 144 mmol/L 137  140   138   Potassium 3.5 - 5.2 mmol/L 4.3  4.4   4.7   Chloride 96 - 106 mmol/L 99  101   101   Calcium 8.7 - 10.2 mg/dL 9.8  9.0   9.0   Phosphorus 2.8 - 4.1 mg/dL 3.0  2.4 Low    3.2   Total Protein 6.0 - 8.5 g/dL 7.7  7.1   7.1   Albumin 4.0 - 5.0 g/dL 5.1 High   4.6   4.6   Globulin, Total 1.5 - 4.5 g/dL 2.6  2.5   2.5   Albumin/Globulin Ratio 1.2 - 2.2 2.0  1.8   1.8   Bilirubin Total 0.0 - 1.2 mg/dL 0.4  0.3   0.4   Alkaline Phosphatase 44 - 121 IU/L 48  46   45 R   LDH 121 - 224 IU/L 257 High   225 High    188   AST 0 - 40 IU/L 72 High   21   26   ALT 0 - 44 IU/L 115 High   27   27   GGT 0 - 65 IU/L 40  29   35   Iron 38 - 169 ug/dL 107  67   142   Cholesterol, Total 100 -  199 mg/dL 265 High   213 High    250  High    Triglycerides 0 - 149 mg/dL 159 High   57   191 High    HDL >39 mg/dL 47  46   48   VLDL Cholesterol Cal 5 - 40 mg/dL 30  10   35   LDL Chol Calc (NIH) 0 - 99 mg/dL 188 High   157 High    167 High    Chol/HDL Ratio 0.0 - 5.0 ratio 5.6 High   4.6 CM   5.2 High  CM   Comment:                                   T. Chol/HDL Ratio                                              Men  Women                                1/2 Avg.Risk  3.4    3.3                                    Avg.Risk  5.0    4.4                                 2X Avg.Risk  9.6    7.1                                 3X Avg.Risk 23.4   11.0   Estimated CHD Risk 0.0 - 1.0 times avg. 1.2 High   0.9 CM   1.1 High  CM   Comment: The CHD Risk is based on the T. Chol/HDL ratio. Other  factors affect CHD Risk such as hypertension, smoking,  diabetes, severe obesity, and family history of  premature CHD.   TSH 0.450 - 4.500 uIU/mL 2.270  1.750   3.450   T4, Total 4.5 - 12.0 ug/dL 7.8  7.4   5.5   T3 Uptake Ratio 24 - 39 % $Re'26  24   26   'KKw$ Free Thyroxine Index 1.2 - 4.9 2.0  1.8   1.4   Prostate Specific Ag, Serum 0.0 - 4.0 ng/mL 1.0  0.9 CM   0.8 CM   Comment: Roche ECLIA methodology.  According to the American Urological Association, Serum PSA should  decrease and remain at undetectable levels after radical  prostatectomy. The AUA defines biochemical recurrence as an initial  PSA value 0.2 ng/mL or greater followed by a subsequent confirmatory  PSA value 0.2 ng/mL or greater.  Values obtained with different assay methods or kits cannot be used  interchangeably. Results cannot be interpreted as absolute evidence  of the presence or absence of malignant disease.   WBC 3.4 - 10.8 x10E3/uL 7.9  7.5  8.6  8.7   RBC 4.14 - 5.80 x10E6/uL 5.72  5.52  5.52  5.63   Hemoglobin 13.0 - 17.7 g/dL 17.2  16.2  16.3  17.1   Hematocrit 37.5 - 51.0 % 50.5  49.6  50.2  49.9   MCV 79 - 97 fL 88  90  91  89    MCH 26.6 - 33.0 pg 30.1  29.3  29.5  30.4   MCHC 31.5 - 35.7 g/dL 34.1  32.7  32.5  34.3   RDW 11.6 - 15.4 % 13.0  13.5  13.7  13.4   Platelets 150 - 450 x10E3/uL 279  312  287  276   Neutrophils Not Estab. % 64  62  66  61   Lymphs Not Estab. % $Remove'23  25  23  24   'BOWWxgm$ Monocytes Not Estab. % $Remove'8  8  7  9   'QOsegjz$ Eos Not Estab. % $Remove'4  4  2  4   'wtgljnw$ Basos Not Estab. % $Remove'1  1  1  1   'hbzErSG$ Neutrophils Absolute 1.4 - 7.0 x10E3/uL 5.1  4.7  5.7  5.4   Lymphocytes Absolute 0.7 - 3.1 x10E3/uL 1.8  1.9  2.0  2.1   Monocytes Absolute 0.1 - 0.9 x10E3/uL 0.6  0.6  0.6  0.8   EOS (ABSOLUTE) 0.0 - 0.4 x10E3/uL 0.3  0.3  0.2  0.3   Basophils Absolute 0.0 - 0.2 x10E3/uL 0.0  0.1  0.1  0.1   Immature Granulocytes Not Estab. % 0  0  1  1   Immature Grans          _________________________________________  EKG  Normal sinus rhythm at 77 bpm ____________________________________________   ____________________________________________   INITIAL IMPRESSION / ASSESSMENT AND PLAN  As part of my medical decision making, I reviewed the following data within the [facility]      Discussed lab and EKG results with patient.  Patient is amenable to starting a statin to lower his cholesterol.  Patient was given a consult for sleep study.        ____________________________________________   FINAL CLINICAL IMPRESSION  Well exam ED Discharge Orders          Ordered    rosuvastatin (CRESTOR) 40 MG tablet  Daily        01/02/22 1449             Note:  This document was prepared using Dragon voice recognition software and may include unintentional dictation errors.

## 2022-01-16 NOTE — Addendum Note (Signed)
Addended by: Gardner Candle on: 01/16/2022 08:31 AM   Modules accepted: Orders

## 2022-01-26 ENCOUNTER — Encounter: Payer: Self-pay | Admitting: Internal Medicine

## 2022-01-26 DIAGNOSIS — G4719 Other hypersomnia: Secondary | ICD-10-CM

## 2022-02-02 NOTE — Procedures (Signed)
[facility]  Portable Polysomnogram Report Part 1 Phone: [phone] Fax: [phone]  Patient Name: Raymond Russell, Raymond Russell Recording Device: Tawana Scale  D.O.B.: 13-Dec-1975 Acquisition Number: [id number]  Referring Physician: Nona Dell, PA Acquisition Date: 01/26/2022   History: The patient is a 46 years old male who was referred for evaluation of possible sleep apnea.  Medical History: none reported.  Medications: Crestor, Zyrtec.  PROCEDURE  The unattended portable polysomnogram was conducted on the night of 01/26/2022.  The following parameters were monitored: Nasal and oral airflow, and body position. Additionally, thoracic and abdominal movements were recorded by inductance plethysmography. Oxygen saturation (SpO2) and heart rate (ECG) was monitored using a pulse Oximeter.  The tracing was scored using 30 second epochs. Hypopneas were scored per AASM definition VIIID1.B (4% desaturation).    Description: The total recording time was 490.9 minutes. Sleep parameters are not recorded.  Respiratory monitoring demonstrated significant snoring across the night. Most of the recording was in the supine position There were a total of 43 apneas and hypopneas for a Respiratory Event Index of 5.3 apneas and hypopneas per hour of recording. The average duration of the respiratory events was 24.8 seconds with a maximum duration of 52.0 seconds. The respiratory events were associated with peripheral oxygen desaturations on the average to 93 %. The lowest oxygen desaturation associated with a respiratory event was 85 %. Additionally, the mean oxygen saturation was 93 %. The total duration of oxygen < 90% was 4.5 minutes and <80% was 0.0 minutes.   Cardiac monitoring- The average heart rate during the recording was 65.5 bpm.  Impression: This routine overnight portable polysomnogram demonstrated the presence of obstructive sleep apnea. Overall the Respiratory Event Index was  5.3 apneas and hypopneas per hour of recording with the lowest desaturation to 85 %. Most of the recording was in the supine position.  Recommendations:     A CPAP titration would be recommended due to the severity of the sleep apnea. Some supine sleep should be ensured to optimize the titration. Would recommend weight loss in a patient with a BMI of 29.5 lb/in2.  Nanda Quinton., MD, FAASM Diplomate, American Board of Sleep Medicine  Diplomate, ABPN- Sleep Medicine Electronically reviewed and digitally signed   [facility]  Portable Polysomnogram Report Part 2 Phone: [phone] Fax: [phone]    Study Date: 01/26/2022  Patient Name: Raymond Russell, Raymond Russell Recording Device: Tawana Scale  Sex: M Height: 76.0 in.  D.O.B.: 1976-08-22 Weight: 242.0 lbs.  Age: 73 years B.M.I: 29.5 lb/in2   Times and Durations  Lights off clock time:  9:36:57 PM Total Recording Time (TRT): 490.9 minutes  Lights on clock time: 5:47:51 AM Time In Bed (TIB): 490.9 minutes   Summary  AHI 5.3 OAI 1.2 CAI 0.0 Lowest Desat 85  AHI is the number of apneas and hypopneas per hour. OAI is the number of obstructive apneas per hour. CAI is the number of central apneas per hour. Lowest Desat is the lowest blood oxygen level that lasted at least 2 seconds.  RESPIRATORY EVENTS   Index (#/hour) Total # of Events Mean duration  (sec) Max duration  (sec) # of Events by Position       Supine Prone Left Right Up  Central Apneas 0.0 0 0.0 0.0 0       0 0  Obstructive Apneas 1.2 10 18.4 43.5 10       0 0  Mixed Apneas 0.2 2 15.5 18.5  1       1 0  Hypopneas 3.8 31 27.4 52.0 30       1 0  Apneas + Hypopneas 5.3 43 24.8 52.0 41       2 0  Total 5.3 43 24.8 52.0 41       2 0  Time in Position 444.0       40.6 3.2  AHI in Position 5.5       3.0 0.0    Oximetry Summary   Dur. (min) % TIB  <90 % 4.5 0.9  <85 % 0.0 0.0  <80 % 0.0 0.0  <70 % 0.0 0.0  Total Dur (min) < 89 2.0 min  Average (%) 93  Total #  of Desats 100  Desat Index (#/hour) 12.4  Desat Max (%) 10  Desat Max dur (sec) 45.0  Lowest SpO2 % during sleep 85  Duration of Min SpO2 (sec) 5    Heart Rate Stats  Mean HR during sleep (BPM)  Highest HR during sleep 104  (BPM)  Highest HR during TIB  109 (BPM)    Snoring Summary  Total Snoring Episodes 40  Total Duration with Snoring 6.0 minutes  Mean Duration of Snoring 9.1 seconds  Percentage of Snoring 1.2 %

## 2022-02-14 ENCOUNTER — Encounter: Payer: Self-pay | Admitting: Internal Medicine

## 2022-02-14 DIAGNOSIS — G4733 Obstructive sleep apnea (adult) (pediatric): Secondary | ICD-10-CM

## 2022-02-24 NOTE — Procedures (Signed)
[facility]  Polysomnogram Report Part I  Phone: [phone] Fax: [phone]  Patient Name: Raymond Russell, Raymond Russell. Acquisition Number: [id number]  Date of Birth: [birth year] Acquisition Date: 02/14/2022  Referring Physician: Nona Dell PA-C     History: The patient is a 46 year old male with obstructive sleep apnea for CPAP titration. Medical History: Excessive daytime sleepiness, allergies  Medications: crestor, zyrtec  Procedure: This routine overnight polysomnogram was performed on the Alice 4 or 5 using the standard CPAP/BIPAP protocol. This included 6 channels of EEG, 2 channels of EOG, chin EMG, bilateral anterior tibialis EMG, nasal/oral thermister, PTAF (nasal pressure transducer), chest and abdominal wall movements, EKG, and pulse oximetry.  Description: The total recording time was 354.0 minutes. The total sleep time was 334.0 minutes. There were a total of 11.5 minutes of wakefulness after sleep onset for a goodsleep efficiency of 94.4%. The latency to sleep onset was short at 8.5 minutes. The R sleep onset latency was within normal limits at 105.0 minutes. Sleep parameters, as a percentage of the total sleep time, demonstrated 4.3% of sleep was in N1 sleep, 77.5% N2, 0.0% N3 and 18.1% R sleep. There were a total of 37 arousals for an arousal index of 6.6 arousals per hour of sleep that was normal.  Overall, there were a total of 25 respiratory events for a respiratory disturbance index, which includes apneas, hypopneas and RERAs (increased respiratory effort) of 4.5 respiratory events per hour of sleep during the pressure titration. CPAP was initiated at 4.0 cm of H2O at lights out, 11:12:37 PM. It was titrated in 1-2 cm increments to the final pressure of 9.0 cm of H2O.   Additionally, the baseline oxygen saturation during wakefulness was 97%, during NREM sleep averaged 94%, and during REM sleep averaged 95%. The total duration of oxygen < 90% was 0.1  minutes.  Cardiac monitoring- did not demonstrate transient cardiac decelerations associated with the apneas. There were no significant cardiac rhythm irregularities.   Periodic limb movement monitoring- demonstrated that there were 110 periodic limb movements for a periodic limb movement index of 19.8 periodic limb movements per hour of sleep.   Impression: This patient's obstructive sleep apnea demonstrated significant improvement with the utilization of nasal CPAP.   There was a significantly elevated periodic limb movement index of 19.8 periodic limb movements per hour of sleep.    Recommendations:  CPAP titration is adequate to control the patients OSA with an optimal pressure of 9CWP Nasal Decongestants and antihistamines may be of help for increased upper airway resistance. Weight loss through dietary and lifestyle modifications to include exercise is recommended in the presence of obesity. Alternative treatment options if the patient is not willing or is unable to use CPAP include oral appliances as well as surgical intervention in the right clinical setting. Clinical correlation is recommended. Please feel free to call the office for any further questions or assistance in the care of this patient.    Yevonne Pax, MD [facility] Diplomate ABMS Pulmonary Critical Care Medicine Sleep Medicine Electronically reviewed and digitally signed  [facility] CPAP/BIPAP Polysomnogram Report Part II Phone: [phone] Fax: [phone]  Patient last name Lavell Neck Size 18.0  in. Acquisition [id number]  Patient first name Raymond Russell. Weight 240.0 lbs. Started 02/14/2022 at 11:06:07 PM  Birth date September 21, 1975 Height 74.0 in. Stopped 02/15/2022 at 5:09:37 AM  Age 41      Type Adult BMI 30.8 lb/in2 Duration 354.0  Barbara Cower Coursey RPSGT, Reita Chard Sleep Data: Lights Out: 11:12:37 PM Sleep Onset: 11:21:07 PM  Lights On: 5:06:37 AM Sleep Efficiency: 94.4 %  Total Recording Time: 354.0  min Sleep Latency (from Lights Off) 8.5 min  Total Sleep Time (TST): 334.0 min R Latency (from Sleep Onset): 105.0 min  Sleep Period Time: 345.0 min Total number of awakenings: 12  Wake during sleep: 11.0 min Wake After Sleep Onset (WASO): 11.5 min   Sleep Data:         Arousal Summary: Stage  Latency from lights out (min) Latency from sleep onset (min) Duration (min) % Total Sleep Time  Normal values  N 1 8.5 0.0 14.5 4.3 (5%)  N 2 16.0 7.5 259.0 77.5 (50%)  N 3       0.0 0.0 (20%)  R 113.5 105.0 60.5 18.1 (25%)    Number Index  Spontaneous 32 5.7  Apneas & Hypopneas 0 0.0  RERAs 0 0.0       (Apneas & Hypopneas & RERAs)  (0) (0.0)  Limb Movement 5 0.9  Snore 0 0.0  TOTAL 37 6.6     Respiratory Data:  CA OA MA Apnea Hypopnea* A+ H RERA Total  Number 0 0 0 0 25 25 0 25  Mean Dur (sec) 0.0 0.0 0.0 0.0 18.1 18.1 0.0 18.1  Max Dur (sec) 0.0 0.0 0.0 0.0 30.0 30.0 0.0 30.0  Total Dur (min) 0.0 0.0 0.0 0.0 7.6 7.6 0.0 7.6  % of TST 0.0 0.0 0.0 0.0 2.3 2.3 0.0 2.3  Index (#/h TST) 0.0 0.0 0.0 0.0 4.5 4.5 0.0 4.5  *Hypopneas scored based on 4% or greater desaturation.  Sleep Stage:         Body Position Data:    REM NREM TST  AHI 11.9 2.9 4.5  RDI 11.9 2.9 4.5    Sleep (min) TST (%) REM (min) NREM (min) CA (#) OA (#) MA (#) HYP (#) AHI (#/h) RERA (#) RDI (#/h) Desat (#)  Supine 217.0 64.97 44.5 172.5 0 0 0 16 4.4 0 4.4 38  Non-Supine 117.00 35.03 16.00 101.00 0.00 0.00 0.00 9.00 4.62 0 4.62 15.00  Left: 33.0 9.88 0.0 33.0 0 0 0 5 9.1 0 9.1 8  Right: 84.0 25.15 16.0 68.0 0 0 0 4 2.9 0 2.9 7       Snoring: Total number of snoring episodes  0  Total time with snoring    min (   % of sleep)   Oximetry Distribution:             WK REM NREM TOTAL  Average (%)   97 95 94 94  < 90% 0.0 0.1 0.0 0.1  < 80% 0.0 0.0 0.0 0.0  < 70% 0.0 0.0 0.0 0.0  # of Desaturations* 4 15 34 53  Desat Index (#/hour) 12.1 14.9 7.5 9.5  Desat Max (%) 9 8 6 9   Desat Max Dur  (sec) 42.0 36.0 76.0 76.0  Approx Min O2 during sleep 88  Approx min O2 during a respiratory event 88  Was Oxygen added (Y/N) and final rate No:   0 LPM  *Desaturations based on 3% or greater drop from baseline.   Cheyne Stokes Breathing: None Present    Heart Rate Summary:  Average Heart Rate During Sleep 64.1 bpm      Highest Heart Rate During Sleep (95th %) 70.0 bpm      Highest Heart Rate During Sleep 194 bpm (artifact)  Highest Heart Rate During Recording (TIB) 194 bpm (artifact)   Heart Rate Observations: Event Type # Events   Bradycardia 0 Lowest HR Scored: N/A  Sinus Tachycardia During Sleep 0 Highest HR Scored: N/A  Narrow Complex Tachycardia 0 Highest HR Scored: N/A  Wide Complex Tachycardia 0 Highest HR Scored: N/A  Asystole 0 Longest Pause: N/A  Atrial Fibrillation 0 Duration Longest Event: N/A  Other Arrythmias  No Type:   Periodic Limb Movement Data: (Primary legs unless otherwise noted) Total # Limb Movement 123 Limb Movement Index 22.1  Total # PLMS 110 PLMS Index 19.8  Total # PLMS Arousals 4 PLMS Arousal Index 0.7  Percentage Sleep Time with PLMS 51.90min (15.5 % sleep)  Mean Duration limb movements (secs) 310.1   Brief Summary:     Starting pressure:  cm of H2O Maximum Pressure:  cm of H2O  Mask Type:  Mask Size:   Humidifier used:  Chin Strap used:   CFlex:  BiFlex:     IPAP Level (cmH2O) EPAP Level (cmH2O) Total Duration (min) Sleep Duration (min) Sleep (%) REM (%) CA  #) OA # MA # HYP #) AHI (#/hr) RERAs # RERAs (#/hr) RDI (#/hr)  4 4 27.2 25.2 92.6 0.0 0 0 0 4 9.5 0 0.0 9.5  5 5  16.1 14.1 87.6 0.0 0 0 0 3 12.8 0 0.0 12.8  6 6  189.9 183.9 96.8 10.7 0 0 0 7 2.3 0 0.0 2.3  7 7  13.6 13.6 100.0 100.0 0 0 0 5 22.1 0 0.0 22.1  8 8  16.4 16.4 100.0 100.0 0 0 0 4 14.6 0 0.0 14.6  9 9  80.2 79.2 98.8 11.5 0 0 0 2 1.5 0 0.0 1.5

## 2022-03-16 DIAGNOSIS — G4733 Obstructive sleep apnea (adult) (pediatric): Secondary | ICD-10-CM | POA: Diagnosis not present

## 2022-04-16 DIAGNOSIS — G4733 Obstructive sleep apnea (adult) (pediatric): Secondary | ICD-10-CM | POA: Diagnosis not present

## 2022-04-26 ENCOUNTER — Encounter: Payer: Self-pay | Admitting: Physician Assistant

## 2022-04-26 ENCOUNTER — Ambulatory Visit: Payer: Self-pay | Admitting: Physician Assistant

## 2022-04-26 ENCOUNTER — Ambulatory Visit
Admission: RE | Admit: 2022-04-26 | Discharge: 2022-04-26 | Disposition: A | Discharge status: Home / Self Care | Payer: 59 | Source: Ambulatory Visit | Attending: Physician Assistant | Admitting: Physician Assistant

## 2022-04-26 DIAGNOSIS — M25572 Pain in left ankle and joints of left foot: Secondary | ICD-10-CM | POA: Diagnosis not present

## 2022-04-26 DIAGNOSIS — G8929 Other chronic pain: Secondary | ICD-10-CM

## 2022-04-26 DIAGNOSIS — M79672 Pain in left foot: Secondary | ICD-10-CM | POA: Insufficient documentation

## 2022-04-26 MED ORDER — NAPROXEN 500 MG PO TABS: 500.0000 mg | ORAL_TABLET | Freq: Two times a day (BID) | ORAL | Status: DC

## 2022-04-26 NOTE — Progress Notes (Signed)
   Subjective: Left heel pain    Patient ID: Raymond Russell, male    DOB: Jan 12, 1976, 46 y.o.   MRN: [id number]  HPI Patient complains of 6 months of left heel/foot pain.  Patient states pain is worse with a.m. awakening, gets better during the day, and worsens towards the evening.  Denies any provocative stent except for prolonged standing and walking for complaint.  Describes the pain as "achy".  Rates the pain as a 6/10.  No palliative measure for complaint.   Review of Systems Hyperlipidemia    Objective:   Physical Exam No acute distress.  See nurses note for vital signs.  Examination of the left foot shows mild pes planus.  Neurovascular intact.  Full and equal range of motion.  Patient is moderate guarding palpation plantar aspect of the calcaneus.       Assessment & Plan: Left heel/foot pain   Patient sent for imaging.  Patient given prescription for naproxen.  Patient to follow-up 05/03/2022 reevaluation and x-ray report.

## 2022-04-26 NOTE — Progress Notes (Signed)
Left foot pain for about 6 months. Hurts worse in the mornings. The pain comes and goes.Raymond Russell

## 2022-05-02 ENCOUNTER — Encounter: Payer: Self-pay | Admitting: Physician Assistant

## 2022-05-02 ENCOUNTER — Ambulatory Visit: Payer: 59 | Admitting: Physician Assistant

## 2022-05-02 VITALS — BP 123/85 | HR 78 | Temp 97.8°F | Resp 14 | Wt 250.0 lb

## 2022-05-02 DIAGNOSIS — G8929 Other chronic pain: Secondary | ICD-10-CM

## 2022-05-02 DIAGNOSIS — M722 Plantar fascial fibromatosis: Secondary | ICD-10-CM

## 2022-05-02 NOTE — Progress Notes (Signed)
Here to see provider to review recent labs.  No other c/o.

## 2022-05-02 NOTE — Progress Notes (Signed)
   Subjective: Left foot and heel pain.    Patient ID: Raymond Russell, male    DOB: 1976/06/06, 46 y.o.   MRN: [id number]  HPI Patient presents for follow-up of left foot and heel pain for approximately for 6 months.  Patient was seen on 04/26/2022 and given a prescription for naproxen.  Patient also had a right ankle x-ray to rule out heel spur.   Review of Systems Hyperlipidemia    Objective:   Physical Exam BP is 123/85, pulse 78, respiration 14, temperature 97.8, and patient 90% O2 sat on room air.  Patient weighs 250 pounds BMI is 32.10. X-ray negative for heel spurs.      Assessment & Plan: Plantar fasciitis   Patient advised to continue naproxen pending consult to podiatry for definitive evaluation and treatment.

## 2022-05-09 NOTE — Addendum Note (Signed)
Addended by: Gardner Candle on: 05/09/2022 11:59 AM   Modules accepted: Orders

## 2022-05-11 NOTE — Progress Notes (Unsigned)
[facility] [address] [place], [place] [address]  Pulmonary Sleep Medicine   Office Visit Note  Patient Name: Raymond Russell DOB: Jan 19, 1976 MRN [id number]    Chief Complaint: Obstructive Sleep Apnea visit  Brief History:  Raymond Russell is seen today for an initial evaluation after new setup on CPAP at 9 cmh20.  The patient has a 3 month history of sleep apnea. Patient is not using PAP nightly.  The patient feels about the same after sleeping with PAP.  The patient reports not benefiting from PAP use. Reported sleepiness is  improved and the Epworth Sleepiness Score is 3 out of 24. The patient does not take naps. The patient complains of the following: nasal congestion has caused him to occasionally take off the CPAP, discussed increasing humidity level.  He felt like the pressure is too high. The compliance download shows 57% compliance with an average use time of 3 hours 58 minutes. The AHI is 2.3.  The patient does not complain of limb movements disrupting sleep.  ROS  General: (-) fever, (-) chills, (-) night sweat Nose and Sinuses: (+) nasal stuffiness or itchiness, (-) postnasal drip, (-) nosebleeds, (-) sinus trouble. Mouth and Throat: (-) sore throat, (-) hoarseness. Neck: (-) swollen glands, (-) enlarged thyroid, (-) neck pain. Respiratory: - cough, - shortness of breath, - wheezing. Neurologic: - numbness, - tingling. Psychiatric: - anxiety, - depression   Current Medication: Outpatient Encounter Medications as of 05/15/2022  Medication Sig   cetirizine (ZYRTEC) 10 MG chewable tablet Chew 10 mg by mouth daily.   naproxen (NAPROSYN) 500 MG tablet Take 1 tablet (500 mg total) by mouth 2 (two) times daily with a meal. (Patient not taking: Reported on 05/02/2022)   rosuvastatin (CRESTOR) 40 MG tablet Take 1 tablet (40 mg total) by mouth daily.   No facility-administered encounter medications on file as of 05/15/2022.    Surgical History: Past Surgical  History:  Procedure Laterality Date   CHOLECYSTECTOMY     CHOLECYSTECTOMY  2009   HAND SURGERY     KNEE SURGERY      Medical History: Past Medical History:  Diagnosis Date   Allergy    Hyperlipidemia 11/10/2019    Family History: Non contributory to the present illness  Social History: Social History   Socioeconomic History   Marital status: Married    Spouse name: Not on file   Number of children: Not on file   Years of education: Not on file   Highest education level: Not on file  Occupational History   Not on file  Tobacco Use   Smoking status: Never   Smokeless tobacco: Current    Types: Chew  Substance and Sexual Activity   Alcohol use: Yes   Drug use: Not Currently   Sexual activity: Not on file  Other Topics Concern   Not on file  Social History Narrative   Not on file   Social Determinants of Health   Financial Resource Strain: Not on file  Food Insecurity: Not on file  Transportation Needs: Not on file  Physical Activity: Not on file  Stress: Not on file  Social Connections: Not on file  Intimate Partner Violence: Not on file    Vital Signs: Blood pressure (!) 131/92, pulse 78, resp. rate 16, height 6\' 2"  (1.88 m), weight 248 lb (112.5 kg), SpO2 97 %. Body mass index is 31.84 kg/m.    Examination: General Appearance: The patient is well-developed, well-nourished, and in no distress. Neck  Circumference: 43 cm Skin: Gross inspection of skin unremarkable. Head: normocephalic, no gross deformities. Eyes: no gross deformities noted. ENT: ears appear grossly normal Neurologic: Alert and oriented. No involuntary movements.  STOP BANG RISK ASSESSMENT S (snore) Have you been told that you snore?     NO   T (tired) Are you often tired, fatigued, or sleepy during the day?   NO  O (obstruction) Do you stop breathing, choke, or gasp during sleep? NO   P (pressure) Do you have or are you being treated for high blood pressure? NO   B (BMI) Is your  body index greater than 35 kg/m? NO   A (age) Are you 16 years old or older? NO   N (neck) Do you have a neck circumference greater than 16 inches?   YES   G (gender) Are you a male? YES   TOTAL STOP/BANG "YES" ANSWERS 2       A STOP-Bang score of 2 or less is considered low risk, and a score of 5 or more is high risk for having either moderate or severe OSA. For people who score 3 or 4, doctors may need to perform further assessment to determine how likely they are to have OSA.         EPWORTH SLEEPINESS SCALE:  Scale:  (0)= no chance of dozing; (1)= slight chance of dozing; (2)= moderate chance of dozing; (3)= high chance of dozing  Chance  Situtation    Sitting and reading: 0    Watching TV: 0    Sitting Inactive in public: 0    As a passenger in car: 1      Lying down to rest: 1    Sitting and talking: 0    Sitting quielty after lunch: 1    In a car, stopped in traffic: 0   TOTAL SCORE:   3 out of 24    SLEEP STUDIES:  HST (01/26/22) AHI 5.3, SPO2 85% TITRATION (02/14/22) CPAP AT 9 CMH20   CPAP COMPLIANCE DATA:  Date Range: 03/16/22 - 05/10/22  Average Daily Use: 3 hours 58 minutes  Median Use: 6 hours 30 minutes  Compliance for > 4 Hours: 32 days  AHI: 2.3 respiratory events per hour  Days Used: 37/56  Mask Leak: 21.6  95th Percentile Pressure: 8 cmh20         LABS: No results found for this or any previous visit (from the past 2160 hour(s)).  Radiology: DG Ankle Complete Left  Result Date: 04/27/2022 CLINICAL DATA:  Left plantar heel pain. EXAM: LEFT ANKLE COMPLETE - 3+ VIEW COMPARISON:  None Available. FINDINGS: There is no evidence of fracture, dislocation, or joint effusion. Chronic corticated bony density is identified in the superolateral aspect of the talar dome. There is no plantar calcaneal spur. Soft tissues are unremarkable. IMPRESSION: No acute fracture or dislocation. Chronic corticated bony density is identified in the  superolateral aspect of the talar dome. No plantar calcaneal spur is noted. Electronically Signed   By: Sherian Rein M.D.   On: 04/27/2022 09:51    No results found.  DG Ankle Complete Left  Result Date: 04/27/2022 CLINICAL DATA:  Left plantar heel pain. EXAM: LEFT ANKLE COMPLETE - 3+ VIEW COMPARISON:  None Available. FINDINGS: There is no evidence of fracture, dislocation, or joint effusion. Chronic corticated bony density is identified in the superolateral aspect of the talar dome. There is no plantar calcaneal spur. Soft tissues are unremarkable. IMPRESSION: No acute fracture or dislocation.  Chronic corticated bony density is identified in the superolateral aspect of the talar dome. No plantar calcaneal spur is noted. Electronically Signed   By: Sherian Rein M.D.   On: 04/27/2022 09:51      Assessment and Plan: Patient Active Problem List   Diagnosis Date Noted   OSA (obstructive sleep apnea) 05/15/2022   CPAP use counseling 05/15/2022   Hyperlipidemia 11/10/2019    1. OSA (obstructive sleep apnea) The patient is struggling to  tolerate PAP and reports questionable benefit from PAP use. He has had nasal congestion and mouth venting. He is in a nasal mask. He is reluctant to change to full face mask. He has made his own adjustments to his cpap pressure. We discussed all this. We will put him on apap 5-9 cm, have him increase humidity, do a 2 week download. If no improvement in symptoms he will need to switch to a full face mask, pt will work on managing his allergy symptoms as well. This congestion did not occur until he'd been on cpap for about a month.  The patient was reminded how to clean equipment and advised to replace supplies routinely . The patient was also counselled on weight loss. The compliance is poor. The AHI is 2.3   OSA on cpap- change to apap 5-9 cm. 2 week download. Consider full face mask. Increase humidity in interim. F/u 58m. CPAP continues to be medically necessary to  treat this patient's OSA.    2. CPAP use counseling CPAP Counseling: had a lengthy discussion with the patient regarding the importance of PAP therapy in management of the sleep apnea. Patient appears to understand the risk factor reduction and also understands the risks associated with untreated sleep apnea. Patient will try to make a good faith effort to remain compliant with therapy. Also instructed the patient on proper cleaning of the device including the water must be changed daily if possible and use of distilled water is preferred. Patient understands that the machine should be regularly cleaned with appropriate recommended cleaning solutions that do not damage the PAP machine for example given white vinegar and water rinses. Other methods such as ozone treatment may not be as good as these simple methods to achieve cleaning.     General Counseling: I have discussed the findings of the evaluation and examination with Raymond Russell.  I have also discussed any further diagnostic evaluation thatmay be needed or ordered today. Raymond Russell verbalizes understanding of the findings of todays visit. We also reviewed his medications today and discussed drug interactions and side effects including but not limited excessive drowsiness and altered mental states. We also discussed that there is always a risk not just to him but also people around him. he has been encouraged to call the office with any questions or concerns that should arise related to todays visit.  No orders of the defined types were placed in this encounter.       I have personally obtained a history, examined the patient, evaluated laboratory and imaging results, formulated the assessment and plan and placed orders. This patient was seen today by Emmaline Kluver, PA-C in collaboration with Dr. Freda Munro.   Yevonne Pax, MD [facility] Diplomate ABMS Pulmonary Critical Care Medicine and Sleep Medicine

## 2022-05-15 ENCOUNTER — Ambulatory Visit (INDEPENDENT_AMBULATORY_CARE_PROVIDER_SITE_OTHER): Payer: 59 | Admitting: Internal Medicine

## 2022-05-15 VITALS — BP 131/92 | HR 78 | Resp 16 | Ht 74.0 in | Wt 248.0 lb

## 2022-05-15 DIAGNOSIS — Z7189 Other specified counseling: Secondary | ICD-10-CM | POA: Diagnosis not present

## 2022-05-15 DIAGNOSIS — G4733 Obstructive sleep apnea (adult) (pediatric): Secondary | ICD-10-CM | POA: Diagnosis not present

## 2022-05-15 NOTE — Patient Instructions (Signed)

## 2022-05-17 ENCOUNTER — Ambulatory Visit (INDEPENDENT_AMBULATORY_CARE_PROVIDER_SITE_OTHER): Payer: 59

## 2022-05-17 ENCOUNTER — Ambulatory Visit (INDEPENDENT_AMBULATORY_CARE_PROVIDER_SITE_OTHER): Payer: 59 | Admitting: Podiatry

## 2022-05-17 DIAGNOSIS — M722 Plantar fascial fibromatosis: Secondary | ICD-10-CM

## 2022-05-17 DIAGNOSIS — G4733 Obstructive sleep apnea (adult) (pediatric): Secondary | ICD-10-CM | POA: Diagnosis not present

## 2022-05-17 MED ORDER — MELOXICAM 15 MG PO TABS: 15.0000 mg | ORAL_TABLET | Freq: Every day | ORAL | 3 refills | Status: DC

## 2022-05-17 MED ORDER — METHYLPREDNISOLONE 4 MG PO TBPK: ORAL_TABLET | ORAL | 0 refills | Status: DC

## 2022-05-17 NOTE — Progress Notes (Signed)
Subjective:  Patient ID: Raymond Russell, male    DOB: [birth year],  MRN: [id number] HPI Chief Complaint  Patient presents with   Plantar Fasciitis    "I have pain in my left heel." N - pain in heel L - left D - January O - gradually worse C - sharp pain, especially lst thing in the morning A - rest, walking/standing long periods T - Ibuprofen    46 y.o. male presents with the above complaint.   ROS: Denies fever chills nausea vomit muscle aches pains calf pain back pain chest pain shortness of breath  Past Medical History:  Diagnosis Date   Allergy    Hyperlipidemia 11/10/2019   Past Surgical History:  Procedure Laterality Date   CHOLECYSTECTOMY     CHOLECYSTECTOMY  2009   HAND SURGERY     KNEE SURGERY      Current Outpatient Medications:    meloxicam (MOBIC) 15 MG tablet, Take 1 tablet (15 mg total) by mouth daily., Disp: 30 tablet, Rfl: 3   methylPREDNISolone (MEDROL DOSEPAK) 4 MG TBPK tablet, 6 day dose pack - take as directed, Disp: 21 tablet, Rfl: 0   cetirizine (ZYRTEC) 10 MG chewable tablet, Chew 10 mg by mouth daily., Disp: , Rfl:    naproxen (NAPROSYN) 500 MG tablet, Take 1 tablet (500 mg total) by mouth 2 (two) times daily with a meal. (Patient not taking: Reported on 05/02/2022), Disp: 20 tablet, Rfl: 00   rosuvastatin (CRESTOR) 40 MG tablet, Take 1 tablet (40 mg total) by mouth daily., Disp: 90 tablet, Rfl: 3  No Known Allergies Review of Systems Objective:  There were no vitals filed for this visit.  General: Well developed, nourished, in no acute distress, alert and oriented x3   Dermatological: Skin is warm, dry and supple bilateral. Nails x 10 are well maintained; remaining integument appears unremarkable at this time. There are no open sores, no preulcerative lesions, no rash or signs of infection present.  Vascular: Dorsalis Pedis artery and Posterior Tibial artery pedal pulses are 2/4 bilateral with immedate capillary fill time. Pedal hair  growth present. No varicosities and no lower extremity edema present bilateral.   Neruologic: Grossly intact via light touch bilateral. Vibratory intact via tuning fork bilateral. Protective threshold with Semmes Wienstein monofilament intact to all pedal sites bilateral. Patellar and Achilles deep tendon reflexes 2+ bilateral. No Babinski or clonus noted bilateral.   Musculoskeletal: No gross boney pedal deformities bilateral. No pain, crepitus, or limitation noted with foot and ankle range of motion bilateral. Muscular strength 5/5 in all groups tested bilateral.  He has pain on palpation medial calcaneal tubercle and central plantar calcaneal tubercle left heel.  Gait: Unassisted, Nonantalgic.    Radiographs:  Radiographs taken today demonstrate an osseous immature left foot.  Retrocalcaneal heel spur is present minimal spurring plantar aspect but soft tissue increase in density at the plantar fascial Caney insertion site indicative of Planter fasciitis.  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  Plan: Discussed etiology pathology conservative versus surgical therapies.  I injected the left heel today 20 mg Kenalog 5 mg Marcaine point maximal tenderness.  Placed in a plantar fascia brace to be followed by a night splint.  Start him on methylprednisolone to be followed by meloxicam.  We discussed appropriate shoe gear stretching exercise ice therapy and sugar modifications.  Most likely we will have to get him to a set of orthotics.     Raymond Russell T.  [place], [place]

## 2022-06-14 ENCOUNTER — Ambulatory Visit: Payer: 59 | Admitting: Podiatry

## 2022-06-16 DIAGNOSIS — G4733 Obstructive sleep apnea (adult) (pediatric): Secondary | ICD-10-CM | POA: Diagnosis not present

## 2022-07-17 ENCOUNTER — Ambulatory Visit (INDEPENDENT_AMBULATORY_CARE_PROVIDER_SITE_OTHER): Payer: 59 | Admitting: Internal Medicine

## 2022-07-17 VITALS — BP 153/90 | HR 73 | Resp 16 | Ht 74.0 in | Wt 254.0 lb

## 2022-07-17 DIAGNOSIS — G4733 Obstructive sleep apnea (adult) (pediatric): Secondary | ICD-10-CM

## 2022-07-17 DIAGNOSIS — Z7189 Other specified counseling: Secondary | ICD-10-CM | POA: Diagnosis not present

## 2022-07-17 NOTE — Progress Notes (Signed)
[facility] [address] [place], [place] [address]  Pulmonary Sleep Medicine   Office Visit Note  Patient Name: Raymond Russell DOB: Jun 05, 1976 MRN [id number]    Chief Complaint: Obstructive Sleep Apnea visit  Brief History:  Raymond Russell is seen today for a 2 month follow up on APAP at 5-9 cmh20.  The patient has a 5 month history of sleep apnea. Patient is using PAP nightly.  The patient feels rested after sleeping with PAP.  The patient reports benefiting from PAP use. Reported sleepiness is  improved and the Epworth Sleepiness Score is 1 out of 24. The patient does not take naps. The patient complains of the following: No complaints.  The compliance download shows 90% compliance with an average use time of 6 hours 1 minute. The AHI is 1.9.  The patient does not complain of limb movements disrupting sleep.  ROS  General: (-) fever, (-) chills, (-) night sweat Nose and Sinuses: (-) nasal stuffiness or itchiness, (-) postnasal drip, (-) nosebleeds, (-) sinus trouble. Mouth and Throat: (-) sore throat, (-) hoarseness. Neck: (-) swollen glands, (-) enlarged thyroid, (-) neck pain. Respiratory: - cough, - shortness of breath, - wheezing. Neurologic: - numbness, - tingling. Psychiatric: - anxiety, - depression   Current Medication: Outpatient Encounter Medications as of 07/17/2022  Medication Sig   cetirizine (ZYRTEC) 10 MG chewable tablet Chew 10 mg by mouth daily.   meloxicam (MOBIC) 15 MG tablet Take 1 tablet (15 mg total) by mouth daily.   naproxen (NAPROSYN) 500 MG tablet Take 1 tablet (500 mg total) by mouth 2 (two) times daily with a meal. (Patient not taking: Reported on 05/02/2022)   rosuvastatin (CRESTOR) 40 MG tablet Take 1 tablet (40 mg total) by mouth daily.   [DISCONTINUED] methylPREDNISolone (MEDROL DOSEPAK) 4 MG TBPK tablet 6 day dose pack - take as directed   No facility-administered encounter medications on file as of 07/17/2022.    Surgical  History: Past Surgical History:  Procedure Laterality Date   CHOLECYSTECTOMY     CHOLECYSTECTOMY  2009   HAND SURGERY     KNEE SURGERY      Medical History: Past Medical History:  Diagnosis Date   Allergy    Hyperlipidemia 11/10/2019    Family History: Non contributory to the present illness  Social History: Social History   Socioeconomic History   Marital status: Married    Spouse name: Not on file   Number of children: Not on file   Years of education: Not on file   Highest education level: Not on file  Occupational History   Not on file  Tobacco Use   Smoking status: Never   Smokeless tobacco: Current    Types: Chew  Substance and Sexual Activity   Alcohol use: Yes   Drug use: Not Currently   Sexual activity: Not on file  Other Topics Concern   Not on file  Social History Narrative   Not on file   Social Determinants of Health   Financial Resource Strain: Not on file  Food Insecurity: Not on file  Transportation Needs: Not on file  Physical Activity: Not on file  Stress: Not on file  Social Connections: Not on file  Intimate Partner Violence: Not on file    Vital Signs: Blood pressure (!) 153/90, pulse 73, resp. rate 16, height 6\' 2"  (1.88 m), weight 254 lb (115.2 kg), SpO2 96 %. Body mass index is 32.61 kg/m.    Examination: General Appearance: The patient  is well-developed, well-nourished, and in no distress. Neck Circumference: 43 cm Skin: Gross inspection of skin unremarkable. Head: normocephalic, no gross deformities. Eyes: no gross deformities noted. ENT: ears appear grossly normal Neurologic: Alert and oriented. No involuntary movements.  STOP BANG RISK ASSESSMENT S (snore) Have you been told that you snore?     NO   T (tired) Are you often tired, fatigued, or sleepy during the day?   NO  O (obstruction) Do you stop breathing, choke, or gasp during sleep? NO   P (pressure) Do you have or are you being treated for high blood  pressure? NO   B (BMI) Is your body index greater than 35 kg/m? NO   A (age) Are you 2 years old or older? NO   N (neck) Do you have a neck circumference greater than 16 inches?   YES   G (gender) Are you a male? YES   TOTAL STOP/BANG "YES" ANSWERS 2       A STOP-Bang score of 2 or less is considered low risk, and a score of 5 or more is high risk for having either moderate or severe OSA. For people who score 3 or 4, doctors may need to perform further assessment to determine how likely they are to have OSA.         EPWORTH SLEEPINESS SCALE:  Scale:  (0)= no chance of dozing; (1)= slight chance of dozing; (2)= moderate chance of dozing; (3)= high chance of dozing  Chance  Situtation    Sitting and reading: 0    Watching TV: 1    Sitting Inactive in public: 0    As a passenger in car: 0      Lying down to rest: 0    Sitting and talking: 0    Sitting quielty after lunch: 0    In a car, stopped in traffic: 0   TOTAL SCORE:   1 out of 24    SLEEP STUDIES:  HST (01/26/22) AHI 5.3, SPO2 85% TITRATION (02/14/22) CPAP AT 9 CMH20   CPAP COMPLIANCE DATA:  Date Range: 05/15/22 - 07/12/22  Average Daily Use: 6 hours 1 minute  Median Use: 6 hours 21 minutes  Compliance for > 4 Hours: 53 days  AHI: 1.9 respiratory events per hour  Days Used: 59/59  Mask Leak: 13.3  95th Percentile Pressure: 7.2 cmh20         LABS: No results found for this or any previous visit (from the past 2160 hour(s)).  Radiology: DG Ankle Complete Left  Result Date: 04/27/2022 CLINICAL DATA:  Left plantar heel pain. EXAM: LEFT ANKLE COMPLETE - 3+ VIEW COMPARISON:  None Available. FINDINGS: There is no evidence of fracture, dislocation, or joint effusion. Chronic corticated bony density is identified in the superolateral aspect of the talar dome. There is no plantar calcaneal spur. Soft tissues are unremarkable. IMPRESSION: No acute fracture or dislocation. Chronic corticated bony  density is identified in the superolateral aspect of the talar dome. No plantar calcaneal spur is noted. Electronically Signed   By: Sherian Rein M.D.   On: 04/27/2022 09:51    No results found.  No results found.    Assessment and Plan: Patient Active Problem List   Diagnosis Date Noted   OSA (obstructive sleep apnea) 05/15/2022   CPAP use counseling 05/15/2022   Hyperlipidemia 11/10/2019    1. OSA (obstructive sleep apnea) The patient does tolerate PAP and reports  benefit from PAP use. The patient was reminded  how to clean equipment and advised to replace supplies routinely. The patient was also counselled on weight loss. The compliance is very good. The AHI is 1.9.   OSA on cpap- CPAP continues to be medically necessary to treat this patient's OSA.  Apnea is controlled. Increase length of use, f/u one year.    2. CPAP use counseling CPAP Counseling: had a lengthy discussion with the patient regarding the importance of PAP therapy in management of the sleep apnea. Patient appears to understand the risk factor reduction and also understands the risks associated with untreated sleep apnea. Patient will try to make a good faith effort to remain compliant with therapy. Also instructed the patient on proper cleaning of the device including the water must be changed daily if possible and use of distilled water is preferred. Patient understands that the machine should be regularly cleaned with appropriate recommended cleaning solutions that do not damage the PAP machine for example given white vinegar and water rinses. Other methods such as ozone treatment may not be as good as these simple methods to achieve cleaning.    General Counseling: I have discussed the findings of the evaluation and examination with Raymond Russell.  I have also discussed any further diagnostic evaluation thatmay be needed or ordered today. Raymond Russell verbalizes understanding of the findings of todays visit. We also reviewed  his medications today and discussed drug interactions and side effects including but not limited excessive drowsiness and altered mental states. We also discussed that there is always a risk not just to him but also people around him. he has been encouraged to call the office with any questions or concerns that should arise related to todays visit.  No orders of the defined types were placed in this encounter.       I have personally obtained a history, examined the patient, evaluated laboratory and imaging results, formulated the assessment and plan and placed orders. This patient was seen today by Emmaline Kluver, PA-C in collaboration with Dr. Freda Munro.   Yevonne Pax, MD [facility] Diplomate ABMS Pulmonary Critical Care Medicine and Sleep Medicine

## 2022-07-17 NOTE — Patient Instructions (Signed)

## 2022-08-14 ENCOUNTER — Ambulatory Visit (INDEPENDENT_AMBULATORY_CARE_PROVIDER_SITE_OTHER): Payer: 59 | Admitting: Podiatry

## 2022-08-14 ENCOUNTER — Encounter: Payer: Self-pay | Admitting: Podiatry

## 2022-08-14 DIAGNOSIS — M722 Plantar fascial fibromatosis: Secondary | ICD-10-CM

## 2022-08-14 MED ORDER — TRIAMCINOLONE ACETONIDE 40 MG/ML IJ SUSP
20.0000 mg | Freq: Once | INTRAMUSCULAR | Status: AC
  Administered 2022-08-14: 20 mg

## 2022-08-14 MED ORDER — MELOXICAM 15 MG PO TABS: 15.0000 mg | ORAL_TABLET | Freq: Every day | ORAL | 3 refills | Status: DC

## 2022-08-14 MED ORDER — METHYLPREDNISOLONE 4 MG PO TBPK: ORAL_TABLET | ORAL | 0 refills | Status: DC

## 2022-08-14 NOTE — Progress Notes (Signed)
He presents today for follow-up on his Planter fasciitis states that mid-November I was running on the basketball court with my son we were a player short and the next day I can barely walk on the foot again.  He denies any trauma denies hearing anything crack or pull or tear.  Objective: Vital signs are stable alert and oriented x 3.  Pulses are palpable.  Still has significant pain on palpation medial calcaneal tubercle of the left heel.  No pain on medial lateral compression of the calcaneus.  Assessment: Recurrence Planter fasciitis left.  Plan: Discussed etiology pathology conservative surgical therapies at this point I went ahead reinjected his left heel started him on methylprednisolone to be followed by meloxicam.  He will continue his plantar fascia brace and good shoes.  He was also casted for orthotics today.  I will follow-up with him once those come in.

## 2022-08-16 DIAGNOSIS — G4733 Obstructive sleep apnea (adult) (pediatric): Secondary | ICD-10-CM | POA: Diagnosis not present

## 2022-08-22 DIAGNOSIS — G4730 Sleep apnea, unspecified: Secondary | ICD-10-CM | POA: Diagnosis not present

## 2022-08-22 DIAGNOSIS — E669 Obesity, unspecified: Secondary | ICD-10-CM | POA: Diagnosis not present

## 2022-08-22 DIAGNOSIS — Z79899 Other long term (current) drug therapy: Secondary | ICD-10-CM | POA: Diagnosis not present

## 2022-08-22 DIAGNOSIS — J029 Acute pharyngitis, unspecified: Secondary | ICD-10-CM | POA: Diagnosis not present

## 2022-08-22 DIAGNOSIS — Z6831 Body mass index (BMI) 31.0-31.9, adult: Secondary | ICD-10-CM | POA: Diagnosis not present

## 2022-08-22 DIAGNOSIS — E7800 Pure hypercholesterolemia, unspecified: Secondary | ICD-10-CM | POA: Diagnosis not present

## 2022-08-22 DIAGNOSIS — R5383 Other fatigue: Secondary | ICD-10-CM | POA: Diagnosis not present

## 2022-08-29 DIAGNOSIS — H524 Presbyopia: Secondary | ICD-10-CM | POA: Diagnosis not present

## 2022-09-04 ENCOUNTER — Encounter: Payer: Self-pay | Admitting: Physician Assistant

## 2022-09-04 ENCOUNTER — Ambulatory Visit: Payer: Self-pay | Admitting: Physician Assistant

## 2022-09-04 VITALS — BP 143/97 | HR 94 | Temp 97.7°F | Resp 14 | Ht 74.0 in | Wt 240.0 lb

## 2022-09-04 DIAGNOSIS — R21 Rash and other nonspecific skin eruption: Secondary | ICD-10-CM

## 2022-09-04 MED ORDER — FEXOFENADINE-PSEUDOEPHED ER 60-120 MG PO TB12: 1.0000 | ORAL_TABLET | Freq: Two times a day (BID) | ORAL | 0 refills | Status: DC

## 2022-09-04 MED ORDER — METHYLPREDNISOLONE 4 MG PO TBPK: ORAL_TABLET | ORAL | 0 refills | Status: DC

## 2022-09-04 MED ORDER — METHYLPREDNISOLONE SODIUM SUCC 40 MG IJ SOLR
40.0000 mg | Freq: Once | INTRAMUSCULAR | Status: DC
  Administered 2022-09-04: 40 mg via INTRAMUSCULAR

## 2022-09-04 MED ORDER — METHYLPREDNISOLONE SODIUM SUCC 40 MG IJ SOLR: 40.0000 mg | Freq: Once | INTRAMUSCULAR | Status: AC

## 2022-09-04 NOTE — Progress Notes (Signed)
congestion, body aches, rash on back x2wks pt states rash is aggravating and painful. Pain scale 5 out of 10

## 2022-09-04 NOTE — Progress Notes (Signed)
   Subjective: Nasal congestion and rash    Patient ID: Raymond Russell, male    DOB: 21-Apr-1976, 47 y.o.   MRN: [id number]  HPI Patient complains of nasal congestion and rash.  States nasal congestion for 4 days.  Rash for 2 weeks.  Denies fever chills associated complaint.  Tested negative for COVID-19 and influenza.  Rash is confined to the trunk area.  Mild itching.  Denies new personal hygiene products.  No change in laundry products.     Review of Systems Hyperlipidemia and OSA    Objective:   Physical Exam  BP is 143/97, pulse 94, respiration 14, temperature 97.7, patient 97% O2 sat on room air.  Patient weighs 240 pounds and BMI is 30.81. HEENT is remarkable for edematous nasal turbinates with bilateral maxillary guarding.  Mild postnasal drainage. Faint macular lesions confined to anterior and posterior trunk.  No signs of excoriation or secondary infection.      Assessment & Plan: Nasal congestion and nonspecific rash.  Patient given 40 mg of Solu-Medrol IM at the clinic.  Patient discharged with prescription for Allegra-D and Medrol Dosepak.  Patient advised to follow-up in 3 days if no improvement or worsening complaints.

## 2022-09-16 DIAGNOSIS — G4733 Obstructive sleep apnea (adult) (pediatric): Secondary | ICD-10-CM | POA: Diagnosis not present

## 2022-09-21 DIAGNOSIS — E243 Ectopic ACTH syndrome: Secondary | ICD-10-CM | POA: Diagnosis not present

## 2022-09-21 DIAGNOSIS — R946 Abnormal results of thyroid function studies: Secondary | ICD-10-CM | POA: Diagnosis not present

## 2022-09-21 DIAGNOSIS — E039 Hypothyroidism, unspecified: Secondary | ICD-10-CM | POA: Diagnosis not present

## 2022-09-21 DIAGNOSIS — Z79899 Other long term (current) drug therapy: Secondary | ICD-10-CM | POA: Diagnosis not present

## 2022-09-27 ENCOUNTER — Ambulatory Visit (INDEPENDENT_AMBULATORY_CARE_PROVIDER_SITE_OTHER): Payer: 59 | Admitting: Podiatry

## 2022-09-27 ENCOUNTER — Encounter: Payer: Self-pay | Admitting: Podiatry

## 2022-09-27 DIAGNOSIS — M722 Plantar fascial fibromatosis: Secondary | ICD-10-CM

## 2022-09-27 NOTE — Patient Instructions (Signed)

## 2022-09-27 NOTE — Progress Notes (Signed)
Patient presents today to pick up custom molded foot orthotics recommended by Dr. Milinda Pointer.   Orthotics were dispensed and fit was satisfactory. Reviewed instructions for break-in and wear. Written instructions given to patient.  Patient will follow up as needed.  I advised the patient that he can stop taking the Meloxicam and start using it again if he has any flare-ups.  I also advised him that he can stop using the Plantar Fascial Brace at this time.   [facility] - order # [id number]

## 2022-10-17 DIAGNOSIS — G4733 Obstructive sleep apnea (adult) (pediatric): Secondary | ICD-10-CM | POA: Diagnosis not present

## 2022-10-20 DIAGNOSIS — Z79899 Other long term (current) drug therapy: Secondary | ICD-10-CM | POA: Diagnosis not present

## 2022-10-20 DIAGNOSIS — Z6831 Body mass index (BMI) 31.0-31.9, adult: Secondary | ICD-10-CM | POA: Diagnosis not present

## 2022-10-20 DIAGNOSIS — E7800 Pure hypercholesterolemia, unspecified: Secondary | ICD-10-CM | POA: Diagnosis not present

## 2022-10-20 DIAGNOSIS — R5383 Other fatigue: Secondary | ICD-10-CM | POA: Diagnosis not present

## 2022-10-20 DIAGNOSIS — N400 Enlarged prostate without lower urinary tract symptoms: Secondary | ICD-10-CM | POA: Diagnosis not present

## 2022-10-20 DIAGNOSIS — E669 Obesity, unspecified: Secondary | ICD-10-CM | POA: Diagnosis not present

## 2022-10-20 DIAGNOSIS — R9721 Rising PSA following treatment for malignant neoplasm of prostate: Secondary | ICD-10-CM | POA: Diagnosis not present

## 2022-11-09 DIAGNOSIS — M531 Cervicobrachial syndrome: Secondary | ICD-10-CM | POA: Diagnosis not present

## 2022-11-09 DIAGNOSIS — M9902 Segmental and somatic dysfunction of thoracic region: Secondary | ICD-10-CM | POA: Diagnosis not present

## 2022-11-09 DIAGNOSIS — M9901 Segmental and somatic dysfunction of cervical region: Secondary | ICD-10-CM | POA: Diagnosis not present

## 2022-11-15 DIAGNOSIS — M531 Cervicobrachial syndrome: Secondary | ICD-10-CM | POA: Diagnosis not present

## 2022-11-15 DIAGNOSIS — M9902 Segmental and somatic dysfunction of thoracic region: Secondary | ICD-10-CM | POA: Diagnosis not present

## 2022-11-15 DIAGNOSIS — M9901 Segmental and somatic dysfunction of cervical region: Secondary | ICD-10-CM | POA: Diagnosis not present

## 2022-11-15 DIAGNOSIS — G4733 Obstructive sleep apnea (adult) (pediatric): Secondary | ICD-10-CM | POA: Diagnosis not present

## 2022-11-16 DIAGNOSIS — M25532 Pain in left wrist: Secondary | ICD-10-CM | POA: Diagnosis not present

## 2022-11-16 DIAGNOSIS — R2232 Localized swelling, mass and lump, left upper limb: Secondary | ICD-10-CM | POA: Diagnosis not present

## 2022-11-16 DIAGNOSIS — M79632 Pain in left forearm: Secondary | ICD-10-CM | POA: Diagnosis not present

## 2022-11-17 DIAGNOSIS — E7800 Pure hypercholesterolemia, unspecified: Secondary | ICD-10-CM | POA: Diagnosis not present

## 2022-11-17 DIAGNOSIS — Z6831 Body mass index (BMI) 31.0-31.9, adult: Secondary | ICD-10-CM | POA: Diagnosis not present

## 2022-11-17 DIAGNOSIS — E669 Obesity, unspecified: Secondary | ICD-10-CM | POA: Diagnosis not present

## 2022-11-22 DIAGNOSIS — M9902 Segmental and somatic dysfunction of thoracic region: Secondary | ICD-10-CM | POA: Diagnosis not present

## 2022-11-22 DIAGNOSIS — M9901 Segmental and somatic dysfunction of cervical region: Secondary | ICD-10-CM | POA: Diagnosis not present

## 2022-11-22 DIAGNOSIS — M531 Cervicobrachial syndrome: Secondary | ICD-10-CM | POA: Diagnosis not present

## 2022-11-22 DIAGNOSIS — M79632 Pain in left forearm: Secondary | ICD-10-CM | POA: Diagnosis not present

## 2022-11-30 DIAGNOSIS — M9901 Segmental and somatic dysfunction of cervical region: Secondary | ICD-10-CM | POA: Diagnosis not present

## 2022-11-30 DIAGNOSIS — M9902 Segmental and somatic dysfunction of thoracic region: Secondary | ICD-10-CM | POA: Diagnosis not present

## 2022-11-30 DIAGNOSIS — M531 Cervicobrachial syndrome: Secondary | ICD-10-CM | POA: Diagnosis not present

## 2022-12-01 ENCOUNTER — Ambulatory Visit: Payer: Self-pay

## 2022-12-01 DIAGNOSIS — Z0000 Encounter for general adult medical examination without abnormal findings: Secondary | ICD-10-CM

## 2022-12-01 LAB — POCT URINALYSIS DIPSTICK
Bilirubin, UA: NEGATIVE
Blood, UA: NEGATIVE
Glucose, UA: NEGATIVE
Ketones, UA: NEGATIVE
Leukocytes, UA: NEGATIVE
Nitrite, UA: NEGATIVE
Protein, UA: NEGATIVE
Spec Grav, UA: <=1.005 — AB (ref 1.010–1.025)
Urobilinogen, UA: 0.2 E.U./dL
pH, UA: 6 (ref 5.0–8.0)

## 2022-12-02 LAB — CMP12+LP+TP+TSH+6AC+PSA+CBC…
ALT: 53 IU/L — ABNORMAL HIGH (ref 0–44)
AST: 39 IU/L (ref 0–40)
Albumin/Globulin Ratio: 1.8 (ref 1.2–2.2)
Albumin: 4.7 g/dL (ref 4.1–5.1)
Alkaline Phosphatase: 52 IU/L (ref 44–121)
BUN/Creatinine Ratio: 8 — ABNORMAL LOW (ref 9–20)
BUN: 8 mg/dL (ref 6–24)
Basophils Absolute: 0.1 10*3/uL (ref 0.0–0.2)
Basos: 1 %
Bilirubin Total: 0.6 mg/dL (ref 0.0–1.2)
Calcium: 9.5 mg/dL (ref 8.7–10.2)
Chloride: 99 mmol/L (ref 96–106)
Chol/HDL Ratio: 2.8 ratio (ref 0.0–5.0)
Cholesterol, Total: 139 mg/dL (ref 100–199)
Creatinine, Ser: 1.04 mg/dL (ref 0.76–1.27)
EOS (ABSOLUTE): 0.2 10*3/uL (ref 0.0–0.4)
Eos: 2 %
Estimated CHD Risk: <0.5 times avg. (ref 0.0–1.0)
Free Thyroxine Index: 1.6 (ref 1.2–4.9)
GGT: 25 IU/L (ref 0–65)
Globulin, Total: 2.6 g/dL (ref 1.5–4.5)
Glucose: 93 mg/dL (ref 70–99)
HDL: 49 mg/dL (ref >39)
Hematocrit: 49.3 % (ref 37.5–51.0)
Hemoglobin: 16.5 g/dL (ref 13.0–17.7)
Immature Grans (Abs): 0 10*3/uL (ref 0.0–0.1)
Immature Granulocytes: 0 %
Iron: 120 ug/dL (ref 38–169)
LDH: 215 IU/L (ref 121–224)
LDL Chol Calc (NIH): 75 mg/dL (ref 0–99)
Lymphocytes Absolute: 2 10*3/uL (ref 0.7–3.1)
Lymphs: 22 %
MCH: 30.1 pg (ref 26.6–33.0)
MCHC: 33.5 g/dL (ref 31.5–35.7)
MCV: 90 fL (ref 79–97)
Monocytes Absolute: 0.7 10*3/uL (ref 0.1–0.9)
Monocytes: 7 %
Neutrophils Absolute: 6 10*3/uL (ref 1.4–7.0)
Neutrophils: 68 %
Phosphorus: 4.1 mg/dL (ref 2.8–4.1)
Platelets: 305 10*3/uL (ref 150–450)
Potassium: 4.4 mmol/L (ref 3.5–5.2)
Prostate Specific Ag, Serum: 1 ng/mL (ref 0.0–4.0)
RBC: 5.48 x10E6/uL (ref 4.14–5.80)
RDW: 13.4 % (ref 11.6–15.4)
Sodium: 138 mmol/L (ref 134–144)
T3 Uptake Ratio: 24 % (ref 24–39)
T4, Total: 6.8 ug/dL (ref 4.5–12.0)
TSH: 3.35 u[IU]/mL (ref 0.450–4.500)
Total Protein: 7.3 g/dL (ref 6.0–8.5)
Triglycerides: 77 mg/dL (ref 0–149)
Uric Acid: 4.9 mg/dL (ref 3.8–8.4)
VLDL Cholesterol Cal: 15 mg/dL (ref 5–40)
WBC: 8.9 10*3/uL (ref 3.4–10.8)
eGFR: 89 mL/min/{1.73_m2} (ref >59)

## 2022-12-07 ENCOUNTER — Encounter: Payer: Self-pay | Admitting: Physician Assistant

## 2022-12-07 ENCOUNTER — Ambulatory Visit: Payer: Self-pay | Admitting: Physician Assistant

## 2022-12-07 VITALS — BP 122/91 | HR 84 | Temp 97.8°F | Resp 12 | Ht 74.0 in | Wt 228.0 lb

## 2022-12-07 DIAGNOSIS — Z0000 Encounter for general adult medical examination without abnormal findings: Secondary | ICD-10-CM

## 2022-12-07 NOTE — Progress Notes (Signed)
[facility]    (approximate)  I have reviewed the triage vital signs and the nursing notes.   HISTORY  Chief Complaint Annual Exam   HPI Thornton DalesWilliam Shannon Russell is a 47 y.o. male presents for annual physical exam.  Voices no concerns or complaints.         Past Medical History:  Diagnosis Date   Allergy    Hyperlipidemia 11/10/2019    Patient Active Problem List   Diagnosis Date Noted   OSA (obstructive sleep apnea) 05/15/2022   CPAP use counseling 05/15/2022   Hyperlipidemia 11/10/2019    Past Surgical History:  Procedure Laterality Date   CHOLECYSTECTOMY     CHOLECYSTECTOMY  2009   HAND SURGERY     KNEE SURGERY      Prior to Admission medications   Medication Sig Start Date End Date Taking? Authorizing Provider  rosuvastatin (CRESTOR) 40 MG tablet Take 1 tablet (40 mg total) by mouth daily. 01/02/22  Yes Joni ReiningSmith, Kannen Moxey K, PA-C    Allergies Patient has no known allergies.  Family History  Problem Relation Age of Onset   Colon cancer Paternal Grandfather     Social History Social History   Tobacco Use   Smoking status: Never   Smokeless tobacco: Current    Types: Snuff  Substance Use Topics   Alcohol use: Yes    Comment: OCCASIONALLY   Drug use: Not Currently    Review of Systems Constitutional: No fever/chills Eyes: No visual changes. ENT: No sore throat. Cardiovascular: Denies chest pain. Respiratory: Denies shortness of breath. Gastrointestinal: No abdominal pain.  No nausea, no vomiting.  No diarrhea.  No constipation. Genitourinary: Negative for dysuria. Musculoskeletal: Negative for back pain. Skin: Negative for rash. Neurological: Negative for headaches, focal weakness or numbness. Endocrine: Hyperlipidemia  ____________________________________________   PHYSICAL EXAM:  VITAL SIGNS: BP is 122/91, pulse 84, respiration 12, temperature 97.8, patient 97% O2 sat on room air.  Patient weighs 2 and  28 pounds BMI is 29.27. Constitutional: Alert and oriented. Well appearing and in no acute distress. Eyes: Conjunctivae are normal. PERRL. EOMI. Head: Atraumatic. Nose: No congestion/rhinnorhea. Mouth/Throat: Mucous membranes are moist.  Oropharynx non-erythematous. Neck: No stridor.  No cervical spine tenderness to palpation. Hematological/Lymphatic/Immunilogical: No cervical lymphadenopathy. Cardiovascular: Normal rate, regular rhythm. Grossly normal heart sounds.  Good peripheral circulation. Respiratory: Normal respiratory effort.  No retractions. Lungs CTAB. Gastrointestinal: Soft and nontender. No distention. No abdominal bruits. No CVA tenderness. Genitourinary: Deferred Musculoskeletal: No lower extremity tenderness nor edema.  No joint effusions. Neurologic:  Normal speech and language. No gross focal neurologic deficits are appreciated. No gait instability. Skin:  Skin is warm, dry and intact. No rash noted. Psychiatric: Mood and affect are normal. Speech and behavior are normal.  ____________________________________________   LABS        Component Ref Range & Units 6 d ago 11 mo ago 1 yr ago 3 yr ago  Color, UA Light Yellow light yellow Yellow yellow  Clarity, UA Clear clear Clear clear  Glucose, UA Negative Negative Negative Negative Negative  Bilirubin, UA Negative negative Negative negative  Ketones, UA Negative negative Negative negative  Spec Grav, UA 1.010 - 1.025 <=1.005 Abnormal  <=1.005 Abnormal  1.020 1.025  Blood, UA Negative negative Negative negative  pH, UA 5.0 - 8.0 6.0 7.0 6.0 6.0  Protein, UA Negative Negative Negative Negative Negative  Urobilinogen, UA 0.2 or 1.0 E.U./dL 0.2 0.2 0.2 0.2  Nitrite, UA Negative negative Negative  negative  Leukocytes, UA Negative Negative Negative Negative Negative  Appearance      Odor                     Component Ref Range & Units 6 d ago 11 mo ago 1 yr ago 2 yr ago 3 yr ago  Glucose 70 - 99 mg/dL 93  98 87 R  98 R  Uric Acid 3.8 - 8.4 mg/dL 4.9 7.1 CM 7.6 CM  8.7 High  CM  Comment:            Therapeutic target for gout patients: <6.0  BUN 6 - 24 mg/dL 8 11 10  19   Creatinine, Ser 0.76 - 1.27 mg/dL 2.77 4.12 8.78  6.76  eGFR >59 mL/min/1.73 89 92 91    BUN/Creatinine Ratio 9 - 20 8 Low  11 10  17   Sodium 134 - 144 mmol/L 138 137 140  138  Potassium 3.5 - 5.2 mmol/L 4.4 4.3 4.4  4.7  Chloride 96 - 106 mmol/L 99 99 101  101  Calcium 8.7 - 10.2 mg/dL 9.5 9.8 9.0  9.0  Phosphorus 2.8 - 4.1 mg/dL 4.1 3.0 2.4 Low   3.2  Total Protein 6.0 - 8.5 g/dL 7.3 7.7 7.1  7.1  Albumin 4.1 - 5.1 g/dL 4.7 5.1 High  R 4.6 R  4.6 R  Globulin, Total 1.5 - 4.5 g/dL 2.6 2.6 2.5  2.5  Albumin/Globulin Ratio 1.2 - 2.2 1.8 2.0 1.8  1.8  Bilirubin Total 0.0 - 1.2 mg/dL 0.6 0.4 0.3  0.4  Alkaline Phosphatase 44 - 121 IU/L 52 48 46  45 R  LDH 121 - 224 IU/L 215 257 High  225 High   188  AST 0 - 40 IU/L 39 72 High  21  26  ALT 0 - 44 IU/L 53 High  115 High  27  27  GGT 0 - 65 IU/L 25 40 29  35  Iron 38 - 169 ug/dL 720 947 67  096  Cholesterol, Total 100 - 199 mg/dL 283 662 High  947 High   250 High   Triglycerides 0 - 149 mg/dL 77 654 High  57  650 High   HDL >39 mg/dL 49 47 46  48  VLDL Cholesterol Cal 5 - 40 mg/dL 15 30 10   35  LDL Chol Calc (NIH) 0 - 99 mg/dL 75 354 High  656 High   167 High   Chol/HDL Ratio 0.0 - 5.0 ratio 2.8 5.6 High  CM 4.6 CM  5.2 High  CM  Comment:                                   T. Chol/HDL Ratio                                             Men  Women                               1/2 Avg.Risk  3.4    3.3  Avg.Risk  5.0    4.4                                2X Avg.Risk  9.6    7.1                                3X Avg.Risk 23.4   11.0  Estimated CHD Risk 0.0 - 1.0 times avg.  < 0.5 1.2 High  CM 0.9 CM  1.1 High  CM  Comment: The CHD Risk is based on the T. Chol/HDL ratio. Other factors affect CHD Risk such as  hypertension, smoking, diabetes, severe obesity, and family history of premature CHD.  TSH 0.450 - 4.500 uIU/mL 3.350 2.270 1.750  3.450  T4, Total 4.5 - 12.0 ug/dL 6.8 7.8 7.4  5.5  T3 Uptake Ratio 24 - 39 % 24 26 24  26   Free Thyroxine Index 1.2 - 4.9 1.6 2.0 1.8  1.4  Prostate Specific Ag, Serum 0.0 - 4.0 ng/mL 1.0 1.0 CM 0.9 CM  0.8 CM  Comment: Roche ECLIA methodology. According to the American Urological Association, Serum PSA should decrease and remain at undetectable levels after radical prostatectomy. The AUA defines biochemical recurrence as an initial PSA value 0.2 ng/mL or greater followed by a subsequent confirmatory PSA value 0.2 ng/mL or greater. Values obtained with different assay methods or kits cannot be used interchangeably. Results cannot be interpreted as absolute evidence of the presence or absence of malignant disease.  WBC 3.4 - 10.8 x10E3/uL 8.9 7.9 7.5 8.6 8.7  RBC 4.14 - 5.80 x10E6/uL 5.48 5.72 5.52 5.52 5.63  Hemoglobin 13.0 - 17.7 g/dL 24.5 80.9 98.3 38.2 50.5  Hematocrit 37.5 - 51.0 % 49.3 50.5 49.6 50.2 49.9  MCV 79 - 97 fL 90 88 90 91 89  MCH 26.6 - 33.0 pg 30.1 30.1 29.3 29.5 30.4  MCHC 31.5 - 35.7 g/dL 39.7 67.3 41.9 37.9 02.4  RDW 11.6 - 15.4 % 13.4 13.0 13.5 13.7 13.4  Platelets 150 - 450 x10E3/uL 305 279 312 287 276  Neutrophils Not Estab. % 68 64 62 66 61  Lymphs Not Estab. % 22 23 25 23 24   Monocytes Not Estab. % 7 8 8 7 9   Eos Not Estab. % 2 4 4 2 4   Basos Not Estab. % 1 1 1 1 1   Neutrophils Absolute 1.4 - 7.0 x10E3/uL 6.0 5.1 4.7 5.7 5.4  Lymphocytes Absolute 0.7 - 3.1 x10E3/uL 2.0 1.8 1.9 2.0 2.1  Monocytes Absolute 0.1 - 0.9 x10E3/uL 0.7 0.6 0.6 0.6 0.8  EOS (ABSOLUTE) 0.0 - 0.4 x10E3/uL 0.2 0.3 0.3 0.2 0.3  Basophils Absolute 0.0 - 0.2 x10E3/uL 0.1 0.0 0.1 0.1 0.1  Immature Granulocytes Not Estab. % 0 0 0 1 1  Immature Grans (Abs)           ____________________________________________  EKG  Normal sinus  rhythm at 79 bpm ____________________________________________    ____________________________________________   INITIAL IMPRESSION / ASSESSMENT AND PLAN   As part of my medical decision making, I reviewed the following data within the electronic MEDICAL RECORD NUMBER      No acute findings on physical exam and labs.        ____________________________________________   FINAL CLINICAL IMPRESSION Well exam   ED Discharge Orders     None  Note:  This document was prepared using Dragon voice recognition software and may include unintentional dictation errors.

## 2022-12-07 NOTE — Progress Notes (Signed)
Pt presents today to complete annual physical. Pt denies any issues or concerns at this time.  

## 2022-12-16 DIAGNOSIS — G4733 Obstructive sleep apnea (adult) (pediatric): Secondary | ICD-10-CM | POA: Diagnosis not present

## 2022-12-29 ENCOUNTER — Other Ambulatory Visit: Payer: Self-pay | Admitting: Physician Assistant

## 2022-12-29 DIAGNOSIS — E782 Mixed hyperlipidemia: Secondary | ICD-10-CM

## 2023-01-02 ENCOUNTER — Other Ambulatory Visit: Payer: Self-pay

## 2023-01-02 NOTE — Telephone Encounter (Signed)
Carollee Herter called & said his pharmacy said the clinic hasn't responded to their electronic refill request for Crestor.  Went to [employer] chart & found the electronic refill request for Crestor & routed it to [employer], PA-C.  AMD

## 2023-01-15 DIAGNOSIS — G4733 Obstructive sleep apnea (adult) (pediatric): Secondary | ICD-10-CM | POA: Diagnosis not present

## 2023-02-05 DIAGNOSIS — Z6828 Body mass index (BMI) 28.0-28.9, adult: Secondary | ICD-10-CM | POA: Diagnosis not present

## 2023-02-05 DIAGNOSIS — Z79899 Other long term (current) drug therapy: Secondary | ICD-10-CM | POA: Diagnosis not present

## 2023-02-05 DIAGNOSIS — E663 Overweight: Secondary | ICD-10-CM | POA: Diagnosis not present

## 2023-02-05 DIAGNOSIS — E7800 Pure hypercholesterolemia, unspecified: Secondary | ICD-10-CM | POA: Diagnosis not present

## 2023-02-15 DIAGNOSIS — G4733 Obstructive sleep apnea (adult) (pediatric): Secondary | ICD-10-CM | POA: Diagnosis not present

## 2023-03-17 DIAGNOSIS — G4733 Obstructive sleep apnea (adult) (pediatric): Secondary | ICD-10-CM | POA: Diagnosis not present

## 2023-04-13 DIAGNOSIS — S0181XA Laceration without foreign body of other part of head, initial encounter: Secondary | ICD-10-CM | POA: Diagnosis not present

## 2023-04-17 DIAGNOSIS — G4733 Obstructive sleep apnea (adult) (pediatric): Secondary | ICD-10-CM | POA: Diagnosis not present

## 2023-04-20 DIAGNOSIS — Z4802 Encounter for removal of sutures: Secondary | ICD-10-CM | POA: Diagnosis not present

## 2023-05-04 DIAGNOSIS — E785 Hyperlipidemia, unspecified: Secondary | ICD-10-CM | POA: Diagnosis not present

## 2023-05-04 DIAGNOSIS — E291 Testicular hypofunction: Secondary | ICD-10-CM | POA: Diagnosis not present

## 2023-05-04 DIAGNOSIS — E782 Mixed hyperlipidemia: Secondary | ICD-10-CM | POA: Diagnosis not present

## 2023-05-04 DIAGNOSIS — R5383 Other fatigue: Secondary | ICD-10-CM | POA: Diagnosis not present

## 2023-05-04 DIAGNOSIS — R799 Abnormal finding of blood chemistry, unspecified: Secondary | ICD-10-CM | POA: Diagnosis not present

## 2023-05-04 DIAGNOSIS — E7800 Pure hypercholesterolemia, unspecified: Secondary | ICD-10-CM | POA: Diagnosis not present

## 2023-05-04 DIAGNOSIS — E559 Vitamin D deficiency, unspecified: Secondary | ICD-10-CM | POA: Diagnosis not present

## 2023-05-04 DIAGNOSIS — R5381 Other malaise: Secondary | ICD-10-CM | POA: Diagnosis not present

## 2023-06-02 ENCOUNTER — Other Ambulatory Visit: Payer: Self-pay | Admitting: Podiatry

## 2023-06-12 DIAGNOSIS — R5383 Other fatigue: Secondary | ICD-10-CM | POA: Diagnosis not present

## 2023-06-12 DIAGNOSIS — J00 Acute nasopharyngitis [common cold]: Secondary | ICD-10-CM | POA: Diagnosis not present

## 2023-08-16 ENCOUNTER — Ambulatory Visit: Payer: 59

## 2023-08-16 DIAGNOSIS — Z1211 Encounter for screening for malignant neoplasm of colon: Secondary | ICD-10-CM | POA: Diagnosis not present

## 2023-08-16 DIAGNOSIS — K640 First degree hemorrhoids: Secondary | ICD-10-CM | POA: Diagnosis not present

## 2023-08-16 DIAGNOSIS — Z800 Family history of malignant neoplasm of digestive organs: Secondary | ICD-10-CM | POA: Diagnosis not present

## 2023-09-18 ENCOUNTER — Emergency Department
Admission: EM | Admit: 2023-09-18 | Discharge: 2023-09-18 | Disposition: A | Discharge status: Home / Self Care | Payer: 59 | Attending: Emergency Medicine | Admitting: Emergency Medicine

## 2023-09-18 ENCOUNTER — Emergency Department: Payer: 59

## 2023-09-18 ENCOUNTER — Encounter: Payer: Self-pay | Admitting: Intensive Care

## 2023-09-18 ENCOUNTER — Other Ambulatory Visit: Payer: Self-pay

## 2023-09-18 DIAGNOSIS — Z8616 Personal history of COVID-19: Secondary | ICD-10-CM | POA: Insufficient documentation

## 2023-09-18 DIAGNOSIS — M25512 Pain in left shoulder: Secondary | ICD-10-CM | POA: Insufficient documentation

## 2023-09-18 DIAGNOSIS — R0602 Shortness of breath: Secondary | ICD-10-CM | POA: Insufficient documentation

## 2023-09-18 LAB — CBC WITH DIFFERENTIAL/PLATELET
Abs Immature Granulocytes: 0.03 10*3/uL (ref 0.00–0.07)
Basophils Absolute: 0.1 10*3/uL (ref 0.0–0.1)
Basophils Relative: 1 %
Eosinophils Absolute: 0.3 10*3/uL (ref 0.0–0.5)
Eosinophils Relative: 3 %
HCT: 46.8 % (ref 39.0–52.0)
Hemoglobin: 15.7 g/dL (ref 13.0–17.0)
Immature Granulocytes: 0 %
Lymphocytes Relative: 27 %
Lymphs Abs: 2.8 10*3/uL (ref 0.7–4.0)
MCH: 30.5 pg (ref 26.0–34.0)
MCHC: 33.5 g/dL (ref 30.0–36.0)
MCV: 91.1 fL (ref 80.0–100.0)
Monocytes Absolute: 0.8 10*3/uL (ref 0.1–1.0)
Monocytes Relative: 7 %
Neutro Abs: 6.6 10*3/uL (ref 1.7–7.7)
Neutrophils Relative %: 62 %
Platelets: 261 10*3/uL (ref 150–400)
RBC: 5.14 MIL/uL (ref 4.22–5.81)
RDW: 14.1 % (ref 11.5–15.5)
WBC: 10.6 10*3/uL — ABNORMAL HIGH (ref 4.0–10.5)
nRBC: 0 % (ref 0.0–0.2)

## 2023-09-18 LAB — BASIC METABOLIC PANEL
Anion gap: 9 (ref 5–15)
BUN: 14 mg/dL (ref 6–20)
CO2: 23 mmol/L (ref 22–32)
Calcium: 9 mg/dL (ref 8.9–10.3)
Chloride: 105 mmol/L (ref 98–111)
Creatinine, Ser: 0.85 mg/dL (ref 0.61–1.24)
GFR, Estimated: >60 mL/min (ref >60)
Glucose, Bld: 83 mg/dL (ref 70–99)
Potassium: 3.7 mmol/L (ref 3.5–5.1)
Sodium: 137 mmol/L (ref 135–145)

## 2023-09-18 LAB — TROPONIN I (HIGH SENSITIVITY): Troponin I (High Sensitivity): 4 ng/L (ref <18)

## 2023-09-18 LAB — D-DIMER, QUANTITATIVE: D-Dimer, Quant: <0.27 ug{FEU}/mL (ref 0.00–0.50)

## 2023-09-18 NOTE — ED Provider Triage Note (Signed)
Emergency Medicine Provider Triage Evaluation Note  Raymond Russell , a 48 y.o. male  was evaluated in triage.  Pt complains of CP/SOB x1 week. Radiates to shoulder. Recently started exogenous hormones in October, PCP sent him in for r/o PE. No history of PE/DVT. No hemoptysis. No leg swelling.  Review of Systems  Positive: Left sided chest pain/sob Negative: N/v, abd pain, leg swelling  Physical Exam  There were no vitals taken for this visit. Gen:   Awake, no distress   Resp:  Normal effort  MSK:   Moves extremities without difficulty  Other:    Medical Decision Making  Medically screening exam initiated at 5:12 PM.  Appropriate orders placed.  Raymond Russell was informed that the remainder of the evaluation will be completed by another provider, this initial triage assessment does not replace that evaluation, and the importance of remaining in the ED until their evaluation is complete.     Raymond Hoehn, PA-C 09/18/23 1715

## 2023-09-18 NOTE — Discharge Instructions (Addendum)
Please start taking your blood pressure at the same time after waking up for the next week and document the results. If the readings are persistently above 150 on the top (systolic) and/or over 90 on the bottom (diastolic) please follow up with your primary care provider to possibly start antihypertensive medications

## 2023-09-18 NOTE — ED Notes (Signed)
 Patient discharged from ED by provider. Discharge instructions reviewed with patient and all questions answered. Patient ambulatory from ED in NAD.

## 2023-09-18 NOTE — ED Provider Notes (Signed)
[facility] Provider Note   Event Date/Time   First MD Initiated Contact with Patient 09/18/23 1909     (approximate) History  Shortness of Breath and Shoulder Pain  HPI Raymond Russell is a 48 y.o. male with no stated past medical history presents complaining of intermittent shortness of breath and left shoulder pain that began approximately 2 days prior to arrival.  Patient states that the shortness of breath is been intermittent over the last week.  Patient states that it is worse with exertion but occasionally occurs spontaneously.  Patient states that he was given an inhaler after the last time that he had COVID and has been using this inhaler for his shortness of breath that has not improved his symptoms. ROS: Patient currently denies any vision changes, tinnitus, difficulty speaking, facial droop, sore throat, chest pain, abdominal pain, nausea/vomiting/diarrhea, dysuria, or weakness/numbness/paresthesias in any extremity   Physical Exam  Triage Vital Signs: ED Triage Vitals  Encounter Vitals Group     BP 09/18/23 1713 (!) 139/99     Systolic BP Percentile --      Diastolic BP Percentile --      Pulse Rate 09/18/23 1712 76     Resp 09/18/23 1712 18     Temp 09/18/23 1712 98.2 F (36.8 C)     Temp Source 09/18/23 1712 Oral     SpO2 09/18/23 1712 99 %     Weight 09/18/23 1712 220 lb (99.8 kg)     Height 09/18/23 1712 6\' 2"  (1.88 m)     Head Circumference --      Peak Flow --      Pain Score 09/18/23 1712 4     Pain Loc --      Pain Education --      Exclude from Growth Chart --    Most recent vital signs: Vitals:   09/18/23 1712 09/18/23 1713  BP:  (!) 139/99  Pulse: 76   Resp: 18   Temp: 98.2 F (36.8 C)   SpO2: 99%    General: Awake, oriented x4. CV:  Good peripheral perfusion.  Resp:  Normal effort.  Abd:  No distention.  Other:  Middle-aged well-developed, well-nourished Caucasian male resting comfortably in no acute  distress ED Results / Procedures / Treatments  Labs (all labs ordered are listed, but only abnormal results are displayed) Labs Reviewed  CBC WITH DIFFERENTIAL/PLATELET - Abnormal; Notable for the following components:      Result Value   WBC 10.6 (*)    All other components within normal limits  BASIC METABOLIC PANEL  D-DIMER, QUANTITATIVE  TROPONIN I (HIGH SENSITIVITY)  TROPONIN I (HIGH SENSITIVITY)   EKG ED ECG REPORT I, Merwyn Katos, the attending physician, personally viewed and interpreted this ECG. Date: 09/18/2023 EKG Time: 1715 Rate: 76 Rhythm: normal sinus rhythm QRS Axis: normal Intervals: normal ST/T Wave abnormalities: normal Narrative Interpretation: no evidence of acute ischemia RADIOLOGY ED MD interpretation: 2 view chest x-ray interpreted by me shows no evidence of acute abnormalities including no pneumonia, pneumothorax, or widened mediastinum -Agree with radiology assessment Official radiology report(s): DG Chest 2 View Result Date: 09/18/2023 CLINICAL DATA:  Chest pain, left shoulder pain and shortness of breath for the past week. EXAM: CHEST - 2 VIEW COMPARISON:  None Available. FINDINGS: The heart size and mediastinal contours are within normal limits. Both lungs are clear. The visualized skeletal structures are unremarkable. IMPRESSION: No active cardiopulmonary disease. Electronically Signed   By:  Beckie Salts M.D.   On: 09/18/2023 17:42   PROCEDURES: Critical Care performed: No Procedures MEDICATIONS ORDERED IN ED: Medications - No data to display IMPRESSION / MDM / ASSESSMENT AND PLAN / ED COURSE  I reviewed the triage vital signs and the nursing notes.                             The patient is on the cardiac monitor to evaluate for evidence of arrhythmia and/or significant heart rate changes. Patient's presentation is most consistent with acute presentation with potential threat to life or bodily function. The patient is suffering from  shortness of breath, but the immediate cause is not apparent.  Potential causes considered include, but are not limited to, asthma or COPD, congestive heart failure, pulmonary embolism, pneumothorax, coronary syndrome, pneumonia, and pleural effusion.  Despite the evaluation including history, exam, and testing, the cause of the shortness of breath remains unclear. However, during the ED stay, patient's condition improved, and at the time of discharge the shortness of breath is resolved, they are feeling well, and want to go home.  Patient will be discharged with strict return precautions and advice to follow up with primary MD within 24 hours for further evaluation.   FINAL CLINICAL IMPRESSION(S) / ED DIAGNOSES   Final diagnoses:  Shortness of breath  Acute pain of left shoulder   Rx / DC Orders   ED Discharge Orders     None      Note:  This document was prepared using Dragon voice recognition software and may include unintentional dictation errors.   Merwyn Katos, MD 09/18/23 [phone]

## 2023-09-18 NOTE — ED Triage Notes (Signed)
Patient c/o sob and left shoulder pain for a week.   PCP sent patient here to check for blood clots. Patient reports he had testosterone pellets injected in October 2024

## 2023-10-15 DIAGNOSIS — H524 Presbyopia: Secondary | ICD-10-CM | POA: Diagnosis not present

## 2023-11-28 DIAGNOSIS — E782 Mixed hyperlipidemia: Secondary | ICD-10-CM | POA: Diagnosis not present

## 2023-11-28 DIAGNOSIS — E559 Vitamin D deficiency, unspecified: Secondary | ICD-10-CM | POA: Diagnosis not present

## 2023-11-28 DIAGNOSIS — Z6829 Body mass index (BMI) 29.0-29.9, adult: Secondary | ICD-10-CM | POA: Diagnosis not present

## 2023-11-28 DIAGNOSIS — R5383 Other fatigue: Secondary | ICD-10-CM | POA: Diagnosis not present

## 2023-11-28 DIAGNOSIS — E663 Overweight: Secondary | ICD-10-CM | POA: Diagnosis not present

## 2023-11-28 DIAGNOSIS — E291 Testicular hypofunction: Secondary | ICD-10-CM | POA: Diagnosis not present

## 2023-12-07 ENCOUNTER — Ambulatory Visit: Payer: 59

## 2023-12-07 DIAGNOSIS — Z0000 Encounter for general adult medical examination without abnormal findings: Secondary | ICD-10-CM

## 2023-12-07 DIAGNOSIS — R5383 Other fatigue: Secondary | ICD-10-CM

## 2023-12-07 LAB — POCT URINALYSIS DIPSTICK
Blood, UA: NEGATIVE
Glucose, UA: NEGATIVE
Ketones, UA: NEGATIVE
Leukocytes, UA: NEGATIVE
Nitrite, UA: NEGATIVE
Protein, UA: POSITIVE — AB
Spec Grav, UA: 1.015 (ref 1.010–1.025)
Urobilinogen, UA: 0.2 U/dL
pH, UA: 6 (ref 5.0–8.0)

## 2023-12-07 NOTE — Progress Notes (Signed)
Pt will complete EKG at scheduled physical. Raymond Russell

## 2023-12-08 LAB — CMP12+LP+TP+TSH+6AC+PSA+CBC…
ALT: 35 IU/L (ref 0–44)
AST: 27 IU/L (ref 0–40)
Albumin: 4.7 g/dL (ref 4.1–5.1)
Alkaline Phosphatase: 45 IU/L (ref 44–121)
BUN/Creatinine Ratio: 16 (ref 9–20)
BUN: 15 mg/dL (ref 6–24)
Basophils Absolute: 0.1 10*3/uL (ref 0.0–0.2)
Basos: 1 %
Bilirubin Total: 0.4 mg/dL (ref 0.0–1.2)
Calcium: 9.3 mg/dL (ref 8.7–10.2)
Chloride: 100 mmol/L (ref 96–106)
Chol/HDL Ratio: 2.7 ratio (ref 0.0–5.0)
Cholesterol, Total: 136 mg/dL (ref 100–199)
Creatinine, Ser: 0.96 mg/dL (ref 0.76–1.27)
EOS (ABSOLUTE): 0.3 10*3/uL (ref 0.0–0.4)
Eos: 4 %
Estimated CHD Risk: <0.5 times avg. (ref 0.0–1.0)
Free Thyroxine Index: 1.8 (ref 1.2–4.9)
GGT: 30 IU/L (ref 0–65)
Globulin, Total: 2 g/dL (ref 1.5–4.5)
Glucose: 90 mg/dL (ref 70–99)
HDL: 51 mg/dL (ref >39)
Hematocrit: 47 % (ref 37.5–51.0)
Hemoglobin: 15.9 g/dL (ref 13.0–17.7)
Immature Grans (Abs): 0 10*3/uL (ref 0.0–0.1)
Immature Granulocytes: 0 %
Iron: 93 ug/dL (ref 38–169)
LDH: 186 IU/L (ref 121–224)
LDL Chol Calc (NIH): 74 mg/dL (ref 0–99)
Lymphocytes Absolute: 2 10*3/uL (ref 0.7–3.1)
Lymphs: 25 %
MCH: 30.7 pg (ref 26.6–33.0)
MCHC: 33.8 g/dL (ref 31.5–35.7)
MCV: 91 fL (ref 79–97)
Monocytes Absolute: 0.7 10*3/uL (ref 0.1–0.9)
Monocytes: 9 %
Neutrophils Absolute: 5 10*3/uL (ref 1.4–7.0)
Neutrophils: 61 %
Phosphorus: 3.6 mg/dL (ref 2.8–4.1)
Platelets: 271 10*3/uL (ref 150–450)
Potassium: 4.8 mmol/L (ref 3.5–5.2)
Prostate Specific Ag, Serum: 0.9 ng/mL (ref 0.0–4.0)
RBC: 5.18 x10E6/uL (ref 4.14–5.80)
RDW: 13.4 % (ref 11.6–15.4)
Sodium: 140 mmol/L (ref 134–144)
T3 Uptake Ratio: 26 % (ref 24–39)
T4, Total: 6.8 ug/dL (ref 4.5–12.0)
TSH: 2 u[IU]/mL (ref 0.450–4.500)
Total Protein: 6.7 g/dL (ref 6.0–8.5)
Triglycerides: 46 mg/dL (ref 0–149)
Uric Acid: 6.9 mg/dL (ref 3.8–8.4)
VLDL Cholesterol Cal: 11 mg/dL (ref 5–40)
WBC: 8 10*3/uL (ref 3.4–10.8)
eGFR: 97 mL/min/{1.73_m2} (ref >59)

## 2023-12-08 LAB — TESTOSTERONE: Testosterone: 379 ng/dL (ref 264–916)

## 2023-12-17 ENCOUNTER — Ambulatory Visit: Payer: 59 | Admitting: Physician Assistant

## 2023-12-17 ENCOUNTER — Encounter: Payer: Self-pay | Admitting: Physician Assistant

## 2023-12-17 VITALS — BP 110/60 | HR 75 | Temp 97.1°F | Resp 16 | Ht 74.0 in | Wt 234.0 lb

## 2023-12-17 DIAGNOSIS — Z0000 Encounter for general adult medical examination without abnormal findings: Secondary | ICD-10-CM

## 2023-12-17 NOTE — Progress Notes (Signed)
 Here for yearly physical with provider and denies any complaints.

## 2023-12-17 NOTE — Progress Notes (Signed)
 [facility]  ____________________________________________   None    (approximate)  I have reviewed the triage vital signs and the nursing notes.   HISTORY  Chief Complaint No chief complaint on file.   HPI Raymond Russell is a 48 y.o. male patient presents for annual physical exam.  Patient voiced no concerns or complaints.         Past Medical History:  Diagnosis Date   Allergy    Hyperlipidemia 11/10/2019    Patient Active Problem List   Diagnosis Date Noted   OSA (obstructive sleep apnea) 05/15/2022   CPAP use counseling 05/15/2022   Hyperlipidemia 11/10/2019    Past Surgical History:  Procedure Laterality Date   CHOLECYSTECTOMY     CHOLECYSTECTOMY  2009   HAND SURGERY     KNEE SURGERY      Prior to Admission medications   Medication Sig Start Date End Date Taking? Authorizing Provider  rosuvastatin  (CRESTOR ) 40 MG tablet TAKE 1 TABLET BY MOUTH EVERY DAY 01/02/23  Yes Marcina Severe, PA-C    Allergies Patient has no known allergies.  Family History  Problem Relation Age of Onset   Colon cancer Paternal Grandfather     Social History Social History   Tobacco Use   Smoking status: Never   Smokeless tobacco: Current    Types: Snuff  Vaping Use   Vaping status: Never Used  Substance Use Topics   Alcohol use: Yes    Alcohol/week: 12.0 standard drinks of alcohol    Types: 12 Cans of beer per week   Drug use: Not Currently    Review of Systems Constitutional: No fever/chills Eyes: No visual changes. ENT: No sore throat. Cardiovascular: Denies chest pain. Respiratory: Denies shortness of breath. Gastrointestinal: No abdominal pain.  No nausea, no vomiting.  No diarrhea.  No constipation. Genitourinary: Negative for dysuria. Musculoskeletal: Negative for back pain. Skin: Negative for rash. Neurological: Negative for headaches, focal weakness or  numbness.   ____________________________________________   PHYSICAL EXAM:  VITAL SIGNS: Constitutional: Alert and oriented. Well appearing and in no acute distress. Eyes: Conjunctivae are normal. PERRL. EOMI. Head: Atraumatic. Nose: No congestion/rhinnorhea. Mouth/Throat: Mucous membranes are moist.  Oropharynx non-erythematous. Neck: No stridor.  No cervical spine tenderness to palpation. Hematological/Lymphatic/Immunilogical: No cervical lymphadenopathy. Cardiovascular: Normal rate, regular rhythm. Grossly normal heart sounds.  Good peripheral circulation. Respiratory: Normal respiratory effort.  No retractions. Lungs CTAB. Gastrointestinal: Soft and nontender. No distention. No abdominal bruits. No CVA tenderness. Genitourinary: Deferred Musculoskeletal: No lower extremity tenderness nor edema.  No joint effusions. Neurologic:  Normal speech and language. No gross focal neurologic deficits are appreciated. No gait instability. Skin:  Skin is warm, dry and intact. No rash noted. Psychiatric: Mood and affect are normal. Speech and behavior are normal.  ____________________________________________   LABS ____________________________________________  EKG        Component Ref Range & Units (hover) 10 d ago (12/07/23) 1 yr ago (12/01/22) 1 yr ago (12/29/21) 2 yr ago (12/24/20) 4 yr ago (11/03/19)  Color, UA dark yellow Light Yellow light yellow Yellow yellow  Clarity, UA clear Clear clear Clear clear  Glucose, UA Negative Negative Negative Negative Negative  Bilirubin, UA 1+ Abnormal  Negative negative Negative negative  Ketones, UA neg Negative negative Negative negative  Spec Grav, UA 1.015 <=1.005 Abnormal  <=1.005 Abnormal  1.020 1.025  Blood, UA neg Negative negative Negative negative  pH, UA 6.0 6.0 7.0 6.0 6.0  Protein, UA Positive Abnormal  Negative Negative Negative Negative  Comment: trace -+  Urobilinogen, UA 0.2 0.2 0.2 0.2 0.2  Nitrite, UA negative Negative  negative Negative negative  Leukocytes, UA Negative Negative Negative Negative Negative  Appearance       Odor             Specimen Collected: 12/07/23 09:36 Last Resulted: 12/07/23 09:36      Lab Flowsheet      Order Details      View Encounter      Lab and Collection Details      Routing      Result History    View All Conversations on this Encounter    Result Care Coordination   Patient Communication   Add Comments   Seen Back to Top   Other Results from 12/07/2023  CMP12+LP+TP+TSH+6AC+PSA+CBC. Order: [id number]  Status: Final result     Next appt: None     Dx: Routine adult health maintenance     Test Result Released: Yes (seen)   0 Result Notes           Component Ref Range & Units (hover) 10 d ago (12/07/23) 3 mo ago (09/18/23) 3 mo ago (09/18/23) 1 yr ago (12/01/22) 1 yr ago (12/29/21) 2 yr ago (12/24/20) 3 yr ago (03/16/20)  Glucose 90 83 CM  93 98 87 R   Uric Acid 6.9   4.9 CM 7.1 CM 7.6 CM   Comment:            Therapeutic target for gout patients: <6.0  BUN 15 14 R  8 11 10    Creatinine, Ser 0.96 0.85 R  1.04 1.01 1.03   eGFR 97   89 92 91   BUN/Creatinine Ratio 16   8 Low  11 10   Sodium 140 137 R  138 137 140   Potassium 4.8 3.7 R  4.4 4.3 4.4   Chloride 100 105 R  99 99 101   Calcium  9.3 9.0 R  9.5 9.8 9.0   Phosphorus 3.6   4.1 3.0 2.4 Low    Total Protein 6.7   7.3 7.7 7.1   Albumin 4.7   4.7 5.1 High  R 4.6 R   Globulin, Total 2.0   2.6 2.6 2.5   Bilirubin Total 0.4   0.6 0.4 0.3   Alkaline Phosphatase 45   52 48 46   LDH 186   215 257 High  225 High    AST 27   39 72 High  21   ALT 35   53 High  115 High  27   GGT 30   25 40 29   Iron 93   120 107 67   Cholesterol, Total 136   139 265 High  213 High    Triglycerides 46   77 159 High  57   HDL 51   49 47 46   VLDL Cholesterol Cal 11   15 30 10    LDL Chol Calc (NIH) 74   75 188 High  157 High    Chol/HDL Ratio 2.7   2.8 CM 5.6 High  CM 4.6 CM   Comment:                                    T. Chol/HDL Ratio  Men  Women                               1/2 Avg.Risk  3.4    3.3                                   Avg.Risk  5.0    4.4                                2X Avg.Risk  9.6    7.1                                3X Avg.Risk 23.4   11.0  Estimated CHD Risk  < 0.5    < 0.5 CM 1.2 High  CM 0.9 CM   Comment: The CHD Risk is based on the T. Chol/HDL ratio. Other factors affect CHD Risk such as hypertension, smoking, diabetes, severe obesity, and family history of premature CHD.  TSH 2.000   3.350 2.270 1.750   T4, Total 6.8   6.8 7.8 7.4   T3 Uptake Ratio 26   24 26 24    Free Thyroxine Index 1.8   1.6 2.0 1.8   Prostate Specific Ag, Serum 0.9   1.0 CM 1.0 CM 0.9 CM   Comment: Roche ECLIA methodology. According to the American Urological Association, Serum PSA should decrease and remain at undetectable levels after radical prostatectomy. The AUA defines biochemical recurrence as an initial PSA value 0.2 ng/mL or greater followed by a subsequent confirmatory PSA value 0.2 ng/mL or greater. Values obtained with different assay methods or kits cannot be used interchangeably. Results cannot be interpreted as absolute evidence of the presence or absence of malignant disease.  WBC 8.0  10.6 High  R 8.9 7.9 7.5 8.6  RBC 5.18  5.14 R 5.48 5.72 5.52 5.52  Hemoglobin 15.9  15.7 R 16.5 17.2 16.2 16.3  Hematocrit 47.0  46.8 R 49.3 50.5 49.6 50.2  MCV 91  91.1 R 90 88 90 91  MCH 30.7  30.5 R 30.1 30.1 29.3 29.5  MCHC 33.8  33.5 R 33.5 34.1 32.7 32.5  RDW 13.4  14.1 R 13.4 13.0 13.5 13.7  Platelets 271  261 R 305 279 312 287  Neutrophils 61  62 R 68 64 62 66  Lymphs 25   22 23 25 23   Monocytes 9   7 8 8 7   Eos 4   2 4 4 2   Basos 1   1 1 1 1   Neutrophils Absolute 5.0  6.6 R 6.0 5.1 4.7 5.7  Lymphocytes Absolute 2.0  2.8 R 2.0 1.8 1.9 2.0  Monocytes Absolute 0.7   0.7 0.6 0.6 0.6  EOS (ABSOLUTE) 0.3   0.2 0.3 0.3 0.2  Basophils  Absolute 0.1  0.1 R 0.1 0.0 0.1 0.1  Immature Granulocytes 0  0 R 0 0 0 1  Immature Grans (Abs) 0.0   0.0 0.0 0.0 0.0                ____________________________________________   ____________________________________________   INITIAL IMPRESSION / ASSESSMENT AND PLAN  As part of my medical decision making, I reviewed the following data within the electronic MEDICAL RECORD NUMBER Notes  from prior ED visits and  Controlled Substance Database     No acute findings on physical exam, EKG, labs.        ____________________________________________   FINAL CLINICAL IMPRESSION     ED Discharge Orders     None        Note:  This document was prepared using Dragon voice recognition software and may include unintentional dictation errors.

## 2024-01-10 ENCOUNTER — Other Ambulatory Visit: Payer: Self-pay | Admitting: Physician Assistant

## 2024-01-10 MED ORDER — METHYLPREDNISOLONE 4 MG PO TBPK: ORAL_TABLET | ORAL | 0 refills | Status: AC

## 2024-01-10 MED ORDER — HYDROXYZINE PAMOATE 25 MG PO CAPS: 25.0000 mg | ORAL_CAPSULE | Freq: Three times a day (TID) | ORAL | 0 refills | Status: AC | PRN

## 2024-01-15 ENCOUNTER — Other Ambulatory Visit: Payer: Self-pay

## 2024-01-15 DIAGNOSIS — Z0283 Encounter for blood-alcohol and blood-drug test: Secondary | ICD-10-CM

## 2024-01-15 NOTE — Progress Notes (Signed)
Pt completed random UDS and ETOH. ETOH is cleared UDS pending with [facility].

## 2024-05-13 DIAGNOSIS — M7712 Lateral epicondylitis, left elbow: Secondary | ICD-10-CM | POA: Diagnosis not present

## 2024-06-12 ENCOUNTER — Other Ambulatory Visit: Payer: Self-pay

## 2024-06-12 NOTE — Progress Notes (Signed)
 Random UDS. Results pending with [facility].

## 2024-07-28 DIAGNOSIS — M25521 Pain in right elbow: Secondary | ICD-10-CM | POA: Diagnosis not present

## 2024-07-31 DIAGNOSIS — M25521 Pain in right elbow: Secondary | ICD-10-CM | POA: Diagnosis not present

## 2024-08-07 DIAGNOSIS — S46211A Strain of muscle, fascia and tendon of other parts of biceps, right arm, initial encounter: Secondary | ICD-10-CM | POA: Diagnosis not present

## 2024-08-08 DIAGNOSIS — Z01818 Encounter for other preprocedural examination: Secondary | ICD-10-CM | POA: Diagnosis not present

## 2024-08-11 DIAGNOSIS — S46211A Strain of muscle, fascia and tendon of other parts of biceps, right arm, initial encounter: Secondary | ICD-10-CM | POA: Diagnosis not present
